# Patient Record
Sex: Female | Born: 1970 | Race: White | Hispanic: No | Marital: Married | Smoking: Never smoker
Health system: Southern US, Community
[De-identification: ages and names within clinical notes are randomized; demographics above are authoritative.]

## PROBLEM LIST (undated history)

## (undated) HISTORY — PX: FOOT SURGERY: SHX648

## (undated) HISTORY — PX: OTHER SURGICAL HISTORY: SHX169

---

## 1997-09-27 ENCOUNTER — Other Ambulatory Visit: Admission: RE | Admit: 1997-09-27 | Discharge: 1997-09-27 | Payer: Self-pay | Admitting: Obstetrics and Gynecology

## 1997-10-16 ENCOUNTER — Other Ambulatory Visit: Admission: RE | Admit: 1997-10-16 | Discharge: 1997-10-16 | Payer: Self-pay | Admitting: Obstetrics and Gynecology

## 1998-09-29 ENCOUNTER — Other Ambulatory Visit: Admission: RE | Admit: 1998-09-29 | Discharge: 1998-09-29 | Payer: Self-pay | Admitting: Obstetrics and Gynecology

## 1999-10-19 ENCOUNTER — Other Ambulatory Visit: Admission: RE | Admit: 1999-10-19 | Discharge: 1999-10-19 | Payer: Self-pay | Admitting: Obstetrics and Gynecology

## 2000-11-18 ENCOUNTER — Other Ambulatory Visit: Admission: RE | Admit: 2000-11-18 | Discharge: 2000-11-18 | Payer: Self-pay | Admitting: Obstetrics and Gynecology

## 2001-11-27 ENCOUNTER — Other Ambulatory Visit: Admission: RE | Admit: 2001-11-27 | Discharge: 2001-11-27 | Payer: Self-pay | Admitting: Obstetrics and Gynecology

## 2002-12-17 ENCOUNTER — Other Ambulatory Visit: Admission: RE | Admit: 2002-12-17 | Discharge: 2002-12-17 | Payer: Self-pay | Admitting: Obstetrics and Gynecology

## 2004-01-21 ENCOUNTER — Other Ambulatory Visit: Admission: RE | Admit: 2004-01-21 | Discharge: 2004-01-21 | Payer: Self-pay | Admitting: Obstetrics and Gynecology

## 2005-02-12 ENCOUNTER — Other Ambulatory Visit: Admission: RE | Admit: 2005-02-12 | Discharge: 2005-02-12 | Payer: Self-pay | Admitting: Obstetrics and Gynecology

## 2008-01-05 DIAGNOSIS — L570 Actinic keratosis: Secondary | ICD-10-CM

## 2008-01-05 HISTORY — DX: Actinic keratosis: L57.0

## 2010-11-24 ENCOUNTER — Ambulatory Visit (HOSPITAL_BASED_OUTPATIENT_CLINIC_OR_DEPARTMENT_OTHER)
Admission: RE | Admit: 2010-11-24 | Discharge: 2010-11-25 | Disposition: A | Discharge status: Home / Self Care | Payer: Federal, State, Local not specified - PPO | Source: Ambulatory Visit | Attending: Orthopaedic Surgery | Admitting: Orthopaedic Surgery

## 2010-11-24 DIAGNOSIS — M2240 Chondromalacia patellae, unspecified knee: Secondary | ICD-10-CM | POA: Insufficient documentation

## 2010-11-24 DIAGNOSIS — S83509A Sprain of unspecified cruciate ligament of unspecified knee, initial encounter: Secondary | ICD-10-CM | POA: Insufficient documentation

## 2010-11-24 DIAGNOSIS — X58XXXA Exposure to other specified factors, initial encounter: Secondary | ICD-10-CM | POA: Insufficient documentation

## 2010-11-24 DIAGNOSIS — Z01812 Encounter for preprocedural laboratory examination: Secondary | ICD-10-CM | POA: Insufficient documentation

## 2010-11-24 LAB — POCT HEMOGLOBIN-HEMACUE: Hemoglobin: 14.3 g/dL (ref 12.0–15.0)

## 2010-12-23 NOTE — Op Note (Signed)
NAMECHARLYN, Nancy Graham            ACCOUNT NO.:  [id number]  MEDICAL RECORD NO.:  [id number]  LOCATION:                                 FACILITY:  PHYSICIAN:  Lubertha Basque. Jerl Santos, M.D.     DATE OF BIRTH:  DATE OF PROCEDURE:  11/24/2010 DATE OF DISCHARGE:                              OPERATIVE REPORT   PREOPERATIVE DIAGNOSES: 1. Right knee anterior cruciate ligament tear. 2. Right knee torn medial meniscus.  POSTOPERATIVE DIAGNOSES: 1. Right knee anterior cruciate ligament tear. 2. Right knee chondromalacia patella.  PROCEDURES: 1. Right knee anterior cruciate ligament reconstruction. 2. Right knee chondroplasty patella.  ANESTHESIA:  General and block.  ATTENDING SURGEON:  Lubertha Basque. Jerl Santos, MD  ASSISTANT:  Lindwood Qua, PA   INDICATIONS FOR PROCEDURE:  The patient is a 40 year old woman who injured her knee a few months back.  She has persisted with an unstable feeling and has not trusted her knee.  By history, exam, and MRI scan, she has an ACL tear and unstable knee.  She is offered reconstruction. Informed operative consent was obtained after discussion of possible complications including reaction to anesthesia, infection, and DVT.  The importance of the postoperative rehabilitation protocol to optimize result was stressed with the patient.  SUMMARY OF FINDINGS AND PROCEDURE:  Under general anesthesia and a block, a right knee arthroscopy was performed.  She had some chondromalacia patella grade 3 in intertrochlear groove and a thorough chondroplasty was done.  The medial compartment exhibited no degenerative change and the meniscus itself looked benign on probing on the superior and inferior aspects.  The ACL was completely torn with a stump of this stuck in an anterior position.  The PCL underneath was intact.  The lateral compartment was benign with no evidence of meniscal or articular cartilage injury.  The ACL stump was removed followed by reconstruction  with middle third patellar tendon allograft stabilized at both ends with metal Linvatec interference screws.  Lindwood Qua assisted throughout and was invaluable to the completion of the case in that he helped position and retract while I performed the procedure. He also fashioned the allograft on the back table while I performed arthroscopic portions of the case thereby significantly minimizing OR time.  DESCRIPTION OF PROCEDURE:  The patient was taken to the operating suite where general anesthetic was applied without difficulty.  She was also given a block in the preanesthesia area.  She was positioned supine and prepped and draped in a normal sterile fashion.  After administration of IV Kefzol, an arthroscopy of the right knee was performed through a total of 2 portals.  Findings were as noted above and procedure consisted of the chondroplasty patella followed by removal of the ACL stump.  A conservative notchplasty was done with a bur until the over- the-top position was well visualized.  The graft was defrosted on the back table while some of this was occurring and was fashioned to fit through 9 and 10 mm tunnels.  Drill holes were placed in each bone plug with a PDS suture placed in one and a wire in the other.  An intraarticular guide was placed just anterior to the PCL.  A separate stab  wound was made anteromedial in the tibia and a guidewire was placed through the stab wound, the tibia, into the knee.  We then over reamed this to a diameter of 11 mm.  A transtibial guide was then placed through this in the over-the-top position relatively low on the femur. A guidewire was placed utilizing this guide out the proximal thigh.  I overreamed the distal femur to a depth of 2.5 cm using a 10-mm reamer. A 1-2 mm posterior wall was well visualized.  Bony debris was removed from the knee with a shaver.  The aforementioned graft was then pulled through the tibial tunnel into the  femoral tunnel.  Care was taken to keep the tendinous surface of the graft in a posterior direction as it entered the tunnel.  We then placed a guidewire anterior to the bone plug in the femoral tunnel and over this placed an 8 x 20 metal Linvatec screw in an interference fashion.  I then tugged on the trailing bone plug and could not dislodge the leading bone plug.  The knee was taken through a good range of motion and the graft was felt to be fairly isometric.  The guidewire was then placed up through the tibial tunnel into the knee and seemed to be there arthroscopically.  Over this, we placed a 9 x 20 metal Linvatec screw securing the trailing bone plug in the tibia.  At this point, the knee ranged fully and the loose Lachman's and drawer tests have been eliminated.  The arthroscopic equipment was removed.  The small wound anteromedial on the tibia was closed with nylon.  Adaptic was applied to the various wounds followed by dry gauze and loose Ace wrap.  Estimated blood loss and intraoperative fluids can be obtained from anesthesia records.  No tourniquet was placed or inflated.  DISPOSITION:  The patient was extubated in the operating room and taken to the recovery room in stable addition.  Plans were for her to stay overnight for observation and pain control with probable discharge home in the morning.     Lubertha Basque Jerl Santos, M.D.     PGD/MEDQ  D:  11/24/2010  T:  11/25/2010  Job:  [id number]  Electronically Signed by Marcene Corning M.D. on 12/23/2010 08:46:43 PM

## 2012-10-03 ENCOUNTER — Ambulatory Visit: Payer: Self-pay | Admitting: Podiatry

## 2013-03-08 ENCOUNTER — Encounter: Payer: Self-pay | Admitting: Podiatry

## 2013-03-08 ENCOUNTER — Ambulatory Visit (INDEPENDENT_AMBULATORY_CARE_PROVIDER_SITE_OTHER): Payer: Federal, State, Local not specified - PPO | Admitting: Podiatry

## 2013-03-08 VITALS — BP 110/76 | HR 84 | Resp 16 | Ht 64.0 in | Wt 198.0 lb

## 2013-03-08 DIAGNOSIS — Z9889 Other specified postprocedural states: Secondary | ICD-10-CM

## 2013-03-08 DIAGNOSIS — M7671 Peroneal tendinitis, right leg: Secondary | ICD-10-CM

## 2013-03-08 DIAGNOSIS — M7750 Other enthesopathy of unspecified foot: Secondary | ICD-10-CM

## 2013-03-08 MED ORDER — METHYLPREDNISOLONE (PAK) 4 MG PO TABS: ORAL_TABLET | ORAL | Status: DC

## 2013-03-08 NOTE — Progress Notes (Signed)
Octavie presents today status post endoscopic plantar fasciotomy from June of 2014. Still complaining of pain to the lateral aspect of the right foot and ankle. Last time she was and we injected her about the fourth fifth metatarsocuboid articulation site. This is going to resolve quite nicely. She states the majority of the pain is present along the peroneal tendons.  Objective evaluation: Pulses remain palpable right lower extremity tenderness on palpation of the peroneal tendons right ankle and foot. No pain on palpation of the medial calcaneal tubercle or the fourth fifth met cuboid articulations.  Assessment: Peroneal tendinitis right.  Plan: Injected Kenalog and local anesthetic to the peroneal tendons today. Wrote her a prescription for Medrol dose pack. I will followup with her in one month at which time if she is no better an MRI will be necessary.

## 2013-04-09 ENCOUNTER — Encounter: Payer: Self-pay | Admitting: Podiatry

## 2013-04-09 ENCOUNTER — Ambulatory Visit (INDEPENDENT_AMBULATORY_CARE_PROVIDER_SITE_OTHER): Payer: Federal, State, Local not specified - PPO | Admitting: Podiatry

## 2013-04-09 VITALS — BP 109/73 | HR 82 | Resp 16 | Ht 64.0 in | Wt 194.0 lb

## 2013-04-09 DIAGNOSIS — M7750 Other enthesopathy of unspecified foot: Secondary | ICD-10-CM

## 2013-04-09 DIAGNOSIS — M778 Other enthesopathies, not elsewhere classified: Secondary | ICD-10-CM

## 2013-04-09 NOTE — Progress Notes (Signed)
Nancy Graham presents today for followup of pain to her right foot. She states that most of the swelling is gone after the steroid pack to be took last time.  Objective: Vital signs are stable she is alert and oriented x3. She has some tenderness on palpation of the peroneal tendons. The majority of her pain is on palpation of the sinus tarsi today and pain on the in range of motion of the subtalar joint.  Assessment: Pain in limb secondary to capsulitis and possible peroneal tendinitis right.  Plan: Injected the subtalar joint today and sinus tarsi with local anesthetic and Kenalog 20 mg. We'll followup with her in one month.

## 2013-04-30 ENCOUNTER — Ambulatory Visit (INDEPENDENT_AMBULATORY_CARE_PROVIDER_SITE_OTHER): Payer: Federal, State, Local not specified - PPO | Admitting: Podiatry

## 2013-04-30 ENCOUNTER — Encounter: Payer: Self-pay | Admitting: Podiatry

## 2013-04-30 VITALS — BP 109/75 | HR 75 | Resp 16

## 2013-04-30 DIAGNOSIS — M7750 Other enthesopathy of unspecified foot: Secondary | ICD-10-CM

## 2013-04-30 DIAGNOSIS — M7670 Peroneal tendinitis, unspecified leg: Secondary | ICD-10-CM

## 2013-04-30 NOTE — Progress Notes (Signed)
   Subjective:    Patient ID: Nancy Graham, female    DOB: February 07, 1971, 42 y.o.   MRN: [id number]  HPI Comments: No change"     Review of Systems     Objective:   Physical Exam: I have reviewed her past medical history medications and allergies. She is status post endoscopic plantar fasciotomy right with lateral compensatory syndrome. Injections to the sinus tarsi into the peroneal tendons as well as to the plantar fascial surgical site have failed to alleviate her symptomatology along the peroneal tendons. She has pain on palpation of the peroneal tendons as they course distally inframalleolar lateral malleolus to the fifth metatarsal base and the arcuate region of the cuboid. All conservative therapies have failed at this point. There is mild overlying edema with fluctuance overlying the peroneal tendons.        Assessment & Plan:  Assessment: Chronic intractable perineal tendinitis right foot status post endoscopic plantar fasciotomy.  Plan: Secondary to failure of conservative therapies an MRI is necessary to evaluate the peroneal tendons for the possibility of a split tear. The split tear is present surgical repair as necessary. This is discussed with the patient today I will followup with her once her MRI has returned.

## 2013-05-01 ENCOUNTER — Other Ambulatory Visit: Payer: Self-pay | Admitting: *Deleted

## 2013-05-01 DIAGNOSIS — M79609 Pain in unspecified limb: Secondary | ICD-10-CM

## 2013-05-01 DIAGNOSIS — M7750 Other enthesopathy of unspecified foot: Secondary | ICD-10-CM

## 2013-05-01 DIAGNOSIS — T148XXA Other injury of unspecified body region, initial encounter: Secondary | ICD-10-CM

## 2013-05-16 ENCOUNTER — Ambulatory Visit: Payer: Self-pay | Admitting: Podiatry

## 2013-05-23 ENCOUNTER — Encounter: Payer: Self-pay | Admitting: Podiatry

## 2013-05-28 ENCOUNTER — Ambulatory Visit (INDEPENDENT_AMBULATORY_CARE_PROVIDER_SITE_OTHER): Payer: Federal, State, Local not specified - PPO | Admitting: Podiatry

## 2013-05-28 ENCOUNTER — Encounter: Payer: Self-pay | Admitting: Podiatry

## 2013-05-28 VITALS — BP 116/78 | HR 78 | Resp 16

## 2013-05-28 DIAGNOSIS — S86319A Strain of muscle(s) and tendon(s) of peroneal muscle group at lower leg level, unspecified leg, initial encounter: Secondary | ICD-10-CM

## 2013-05-28 DIAGNOSIS — S86819A Strain of other muscle(s) and tendon(s) at lower leg level, unspecified leg, initial encounter: Secondary | ICD-10-CM

## 2013-05-28 DIAGNOSIS — S838X9A Sprain of other specified parts of unspecified knee, initial encounter: Secondary | ICD-10-CM

## 2013-05-28 NOTE — Progress Notes (Signed)
Pt states its still there, come to get my mri results today. No change to the right foot.  Objective: Vital signs are stable she is alert and oriented x3. His pain on palpation of the peroneal tendons. MRI report did come back positive for a tear of both her peroneal tendons right foot. Pulses are strongly palpable.  Assessment: Peroneal tendon tears right foot.  Plan we consented her for today for primary repair of peroneal longus and the peroneal brevis tendon with application of cast. We will the consent form today line bylined number by number giving her ample time to ask questions she saw fit regarding these procedures I answered them to the best of my ability in layman's terms. She understood this was amenable to it signed Dr. pages of the consent form and I will followup with her in the near future.

## 2013-06-22 ENCOUNTER — Encounter: Payer: Self-pay | Admitting: Podiatry

## 2013-06-22 DIAGNOSIS — M7750 Other enthesopathy of unspecified foot: Secondary | ICD-10-CM

## 2013-06-27 ENCOUNTER — Ambulatory Visit (INDEPENDENT_AMBULATORY_CARE_PROVIDER_SITE_OTHER): Payer: Federal, State, Local not specified - PPO | Admitting: Podiatry

## 2013-06-27 ENCOUNTER — Encounter: Payer: Self-pay | Admitting: Podiatry

## 2013-06-27 VITALS — BP 129/76 | HR 68 | Resp 16

## 2013-06-27 DIAGNOSIS — Z9889 Other specified postprocedural states: Secondary | ICD-10-CM

## 2013-06-27 NOTE — Progress Notes (Signed)
1. Primary repair peroneus long and peroneus brevis rt foot/ankle 2. Application of cast   rx Percocet 10/325 #50 0 refills one to two by mouth every 6- 8hours as needed for pain Phenergan 25 mg #30 0 refills one by mouth every 6 -8 hours as needed for nausea Keflex 500 mg #20 0 refills one by mouth 2 times daily

## 2013-06-27 NOTE — Progress Notes (Signed)
Post op 2.13.15 , peroneus brevis tendon repair, pt states that it is there , hurting some, last two days didn't have to take pain meds . One week status post peroneal longus repair right ankle.  Objective: Vital signs are stable she is alert and oriented x3. There is no erythema edema saline is drainage or odor. She has pain on palpation of the incision site. She's got a good range of motion of the foot. No signs of infection.  Assessment: Well-healing surgical foot right.  Plan: Redressed today with a dressed a compressive dressing she's to continue use of the Cam Walker all the time a nonweightbearing cast.

## 2013-07-04 ENCOUNTER — Encounter: Payer: Self-pay | Admitting: Podiatry

## 2013-07-04 ENCOUNTER — Ambulatory Visit (INDEPENDENT_AMBULATORY_CARE_PROVIDER_SITE_OTHER): Payer: Federal, State, Local not specified - PPO | Admitting: Podiatry

## 2013-07-04 VITALS — BP 118/82 | HR 82 | Temp 97.8°F | Resp 16

## 2013-07-04 DIAGNOSIS — Z9889 Other specified postprocedural states: Secondary | ICD-10-CM

## 2013-07-04 NOTE — Progress Notes (Signed)
Dos 2.13.15 , peroneus brevis tendon repair right ankle, staples removed , its doing ok, having trouble sleeping at night it feels like it is rubbing and the left hip is hurting. She denies fever chills nausea vomiting muscle aches and pains.  Objective: Vital signs are stable she is alert and oriented x3. Lateral aspect of the right ankle appears to be healing quite nicely we left a few staples intact there is no dehiscence or gapping open of the wound. She has a good range of motion of the ankle and states it really doesn't hurt.  Assessment: Well-healing surgical foot right.  Plan: Redressed the foot today put her back in her Cam Walker nonweightbearing status followup with her in one week to remove the remainder of the staples. At that time my hope to get away from the crutches.

## 2013-07-04 NOTE — Progress Notes (Signed)
Wrote pt a rx for a knee scooter-post op 2.13.15

## 2013-07-11 ENCOUNTER — Ambulatory Visit (INDEPENDENT_AMBULATORY_CARE_PROVIDER_SITE_OTHER): Payer: Federal, State, Local not specified - PPO | Admitting: Podiatry

## 2013-07-11 VITALS — BP 112/72 | HR 84 | Resp 16 | Ht 64.0 in | Wt 189.0 lb

## 2013-07-11 DIAGNOSIS — Z9889 Other specified postprocedural states: Secondary | ICD-10-CM

## 2013-07-11 DIAGNOSIS — S86319A Strain of muscle(s) and tendon(s) of peroneal muscle group at lower leg level, unspecified leg, initial encounter: Secondary | ICD-10-CM

## 2013-07-11 DIAGNOSIS — S838X9A Sprain of other specified parts of unspecified knee, initial encounter: Secondary | ICD-10-CM

## 2013-07-11 DIAGNOSIS — S86819A Strain of other muscle(s) and tendon(s) at lower leg level, unspecified leg, initial encounter: Secondary | ICD-10-CM

## 2013-07-11 NOTE — Progress Notes (Signed)
She presents today 3 weeks status post peroneal tendon repair right foot states that it feels 100% better she has a great range of motion. We remove the remainder of the sutures to the lateral aspect of the right foot today. She has good range of motion of the right foot and ankle today without complications he is to be healing quite nicely.  Assessment: Well-healing surgical foot status post peroneal tendon repair x3 weeks.  Plan: In a compression anklet today we will allow her to start getting this wet I encouraged range of motion exercises she's to continue the use of the Cam Walker all the time. However I did allow her to start putting partial weight bearing to the right foot. I will followup with her in 2 weeks at which time we will try to get out of the boot into a tennis shoe with a brace.

## 2013-07-23 ENCOUNTER — Ambulatory Visit (INDEPENDENT_AMBULATORY_CARE_PROVIDER_SITE_OTHER): Payer: Federal, State, Local not specified - PPO | Admitting: Podiatry

## 2013-07-23 ENCOUNTER — Encounter: Payer: Self-pay | Admitting: Podiatry

## 2013-07-23 VITALS — BP 116/78 | HR 76 | Resp 16

## 2013-07-23 DIAGNOSIS — Z9889 Other specified postprocedural states: Secondary | ICD-10-CM

## 2013-07-23 MED ORDER — METHYLPREDNISOLONE (PAK) 4 MG PO TABS: ORAL_TABLET | ORAL | Status: DC

## 2013-07-23 NOTE — Progress Notes (Signed)
Post op , its doing good, some swelling has started up this week. She presents today ambulating with her crutches and Cam Walker.  Objective: Vital signs are stable she is alert and oriented x3. Little more than a month status post peroneal tendon repair right also states that she has had started to have some of her plantar fasciitis pains return.  Assessment: Well-healing peroneal tendon repair right, residual plantar fasciitis type symptoms right.  Plan: Sterapred Dosepak dispensed today encouraged range of motion exercises. Continue to use the Cam Walker for the next 2 weeks discontinue the use of the crutches and I will followup with her in 2 weeks at which time we will put her in a brace and get her into her tennis shoe.

## 2013-08-06 ENCOUNTER — Ambulatory Visit (INDEPENDENT_AMBULATORY_CARE_PROVIDER_SITE_OTHER): Payer: Federal, State, Local not specified - PPO | Admitting: Podiatry

## 2013-08-06 VITALS — Resp 16 | Ht 64.0 in | Wt 190.0 lb

## 2013-08-06 DIAGNOSIS — S86319A Strain of muscle(s) and tendon(s) of peroneal muscle group at lower leg level, unspecified leg, initial encounter: Secondary | ICD-10-CM

## 2013-08-06 DIAGNOSIS — Z9889 Other specified postprocedural states: Secondary | ICD-10-CM

## 2013-08-06 DIAGNOSIS — S838X9A Sprain of other specified parts of unspecified knee, initial encounter: Secondary | ICD-10-CM

## 2013-08-06 DIAGNOSIS — S86819A Strain of other muscle(s) and tendon(s) at lower leg level, unspecified leg, initial encounter: Secondary | ICD-10-CM

## 2013-08-06 NOTE — Progress Notes (Signed)
Clemencia presents today states it is still having some pain every day. He does not seem to be as bad as it was. She's referring to her lateral ankle repair and her peroneal tendon repair of her right foot.  Objective: Vital signs are stable she is alert and oriented x3. Much decrease in edema to the right foot since last time I saw her. She has good functionality of the peroneal brevis and peroneal longus tendons.  Assessment: Well-healing surgical foot right.  Plan: She is to discontinue use of the Cam Walker start utilizing her try lock brace once again I will followup with her in 2 weeks at which time if she is no better we will send her to physical therapy.

## 2013-08-20 ENCOUNTER — Ambulatory Visit (INDEPENDENT_AMBULATORY_CARE_PROVIDER_SITE_OTHER): Payer: Federal, State, Local not specified - PPO | Admitting: Podiatry

## 2013-08-20 VITALS — Resp 16 | Ht 64.0 in | Wt 190.0 lb

## 2013-08-20 DIAGNOSIS — S86319A Strain of muscle(s) and tendon(s) of peroneal muscle group at lower leg level, unspecified leg, initial encounter: Secondary | ICD-10-CM

## 2013-08-20 DIAGNOSIS — S86819A Strain of other muscle(s) and tendon(s) at lower leg level, unspecified leg, initial encounter: Secondary | ICD-10-CM

## 2013-08-20 DIAGNOSIS — S838X9A Sprain of other specified parts of unspecified knee, initial encounter: Secondary | ICD-10-CM

## 2013-08-20 DIAGNOSIS — Z9889 Other specified postprocedural states: Secondary | ICD-10-CM

## 2013-08-20 NOTE — Progress Notes (Signed)
She presents today for followup of her peroneal tendon repair right foot since February. It is been 8 weeks now. She states is still quite sore she can go for very long periods of time without becoming debilitating.  Objective: Vital signs are stable she is alert and oriented x3. Pulses are palpable she has mild tenderness on palpation of the peroneal tendons of the right foot.  Assessment: Peroneal tendinitis postsurgical.  Plan: We'll send her to physical therapy to see if they can help work some soreness out. I will followup with her in one month

## 2013-09-17 ENCOUNTER — Ambulatory Visit (INDEPENDENT_AMBULATORY_CARE_PROVIDER_SITE_OTHER): Payer: Federal, State, Local not specified - PPO | Admitting: Podiatry

## 2013-09-17 VITALS — BP 119/76 | HR 76 | Resp 16

## 2013-09-17 DIAGNOSIS — S86319A Strain of muscle(s) and tendon(s) of peroneal muscle group at lower leg level, unspecified leg, initial encounter: Secondary | ICD-10-CM

## 2013-09-17 DIAGNOSIS — Z9889 Other specified postprocedural states: Secondary | ICD-10-CM

## 2013-09-17 DIAGNOSIS — S86819A Strain of other muscle(s) and tendon(s) at lower leg level, unspecified leg, initial encounter: Secondary | ICD-10-CM

## 2013-09-17 DIAGNOSIS — S838X9A Sprain of other specified parts of unspecified knee, initial encounter: Secondary | ICD-10-CM

## 2013-09-17 NOTE — Progress Notes (Signed)
She presents today for followup of peroneal tendon repair right foot date of surgery was February 2015. She denies fever chills nausea vomiting. She states that she's doing much better and physical therapy has really made his foot turned the corner.  Objective: Vital signs are stable she is alert and oriented x3 she has no pain on palpation and range of motion of the peroneal tendons either longus or the brevis.  Assessment: Well-healing peroneal tendon repair right.  Plan: Continue physical therapy followup with me in about 6 weeks.

## 2013-10-29 ENCOUNTER — Encounter: Payer: Self-pay | Admitting: Podiatry

## 2013-10-29 ENCOUNTER — Ambulatory Visit (INDEPENDENT_AMBULATORY_CARE_PROVIDER_SITE_OTHER): Payer: Federal, State, Local not specified - PPO | Admitting: Podiatry

## 2013-10-29 DIAGNOSIS — G5791 Unspecified mononeuropathy of right lower limb: Secondary | ICD-10-CM

## 2013-10-29 DIAGNOSIS — Z9889 Other specified postprocedural states: Secondary | ICD-10-CM

## 2013-10-29 DIAGNOSIS — G5790 Unspecified mononeuropathy of unspecified lower limb: Secondary | ICD-10-CM

## 2013-10-29 NOTE — Progress Notes (Signed)
She presents today for followup of her peroneal tendon repair and plantar fasciotomy right foot. She states it finally is starting to turn around and mechanically. She has noticed recently that she started to develop some burning along the medial aspect of her right heel. From the incision site proximally started to burn the burn at any point in time not just at nighttime.  Objective: Of systems is alert and oriented x3 she is no pain on palpation of the peroneal tendons of the right foot. She has full range of motion with the tendons against resistance. She has no reproducible tenderness or burning on palpation of the medial right heel.  Assessment: Well-healing surgical foot right. Neuritis right heel.  Plan: I will followup with her on an as-needed basis for chemical destruction of Baxter's nerve should it become necessary.

## 2013-11-15 ENCOUNTER — Encounter: Payer: Self-pay | Admitting: Podiatry

## 2013-11-15 ENCOUNTER — Ambulatory Visit (INDEPENDENT_AMBULATORY_CARE_PROVIDER_SITE_OTHER): Payer: Federal, State, Local not specified - PPO | Admitting: Podiatry

## 2013-11-15 VITALS — BP 112/71 | HR 78 | Resp 16

## 2013-11-15 DIAGNOSIS — G5790 Unspecified mononeuropathy of unspecified lower limb: Secondary | ICD-10-CM

## 2013-11-15 DIAGNOSIS — G5791 Unspecified mononeuropathy of right lower limb: Secondary | ICD-10-CM

## 2013-11-15 NOTE — Progress Notes (Signed)
She presents today with a chief complaint of heel pain to her right foot. She has a history of plantar fasciitis with a history of an endoscopic fasciotomy. Currently she has developed a neuritis with fasciitis to the right heel. She states that she like to get started with the dehydrated alcohol injections.  Objective: Vital signs are stable she is alert and oriented x3. No erythema edema cellulitis drainage or odor. Pain on palpation medial calcaneal tubercle with radiating pain medially.  Assessment: Neuritis/plantar fasciitis right foot.  Plan: Injected her first dose of dehydrated alcohol today without complication followup with her in 3 weeks.

## 2013-12-03 ENCOUNTER — Ambulatory Visit: Payer: Federal, State, Local not specified - PPO | Admitting: Podiatry

## 2013-12-05 ENCOUNTER — Ambulatory Visit (INDEPENDENT_AMBULATORY_CARE_PROVIDER_SITE_OTHER): Payer: Federal, State, Local not specified - PPO | Admitting: Podiatry

## 2013-12-05 VITALS — BP 115/73 | HR 82 | Resp 16

## 2013-12-05 DIAGNOSIS — G5790 Unspecified mononeuropathy of unspecified lower limb: Secondary | ICD-10-CM

## 2013-12-05 DIAGNOSIS — G5791 Unspecified mononeuropathy of right lower limb: Secondary | ICD-10-CM

## 2013-12-05 NOTE — Progress Notes (Signed)
She presents today for followup with her neuritis/Baxter's neuritis/plantar fasciitis of her right heel. She states it seems to be doing much better from her first dehydrated alcohol injection.  Objective: Vital signs are stable she is alert and oriented x3. Minimal pain on palpation medial calcaneal tubercle of the right heel.  Assessment: Neuritis plantar fasciitis right foot.  Plan: Discussed etiology pathology conservative versus surgical therapies. Injected her second dose of dehydrated alcohol to the point of maximal tenderness right heel. Followup with her in 3 weeks for her third injection.

## 2013-12-24 ENCOUNTER — Ambulatory Visit (INDEPENDENT_AMBULATORY_CARE_PROVIDER_SITE_OTHER): Payer: Federal, State, Local not specified - PPO | Admitting: Podiatry

## 2013-12-24 VITALS — BP 125/81 | HR 90 | Resp 16

## 2013-12-24 DIAGNOSIS — G5791 Unspecified mononeuropathy of right lower limb: Secondary | ICD-10-CM

## 2013-12-24 DIAGNOSIS — G5790 Unspecified mononeuropathy of unspecified lower limb: Secondary | ICD-10-CM

## 2013-12-24 NOTE — Progress Notes (Signed)
She presents today after to injections for neuritis to her right heel. States the right heel is almost 100% better she does have some tenderness that she remarks of the central band of the plantar fascia right.  Objective: Medial band is no pain on palpation. She has pain on palpation of the central band of the plantar fascia minimally so limb limb lateral.  Assessment: Neuritis is resolving possible plantar fasciitis central band right.  Plan: Injected her third dose of dehydrated alcohol today plantar central band today and we will reevaluate in 3 weeks.

## 2014-01-16 ENCOUNTER — Encounter: Payer: Self-pay | Admitting: Podiatry

## 2014-01-16 ENCOUNTER — Ambulatory Visit (INDEPENDENT_AMBULATORY_CARE_PROVIDER_SITE_OTHER): Payer: Federal, State, Local not specified - PPO | Admitting: Podiatry

## 2014-01-16 VITALS — BP 116/76 | HR 88 | Resp 16

## 2014-01-16 DIAGNOSIS — M722 Plantar fascial fibromatosis: Secondary | ICD-10-CM

## 2014-01-16 NOTE — Progress Notes (Signed)
She presents today for followup of neuritis to the plantar aspect of the right foot. She states that she no longer has any burning and tingling in the area she said that part is 100% better however she still has pain right here as she points to the central band of the plantar fascial calcaneal insertion site right heel.  Objective: Vital signs are stable she is alert and oriented x3 she has pain on palpation to the central calcaneal tubercle right heel.  Assessment: Well-healing neuritis and when plantar fasciitis right.  Plan: Under fasciitis central band right foot. Reinjected the right foot today with Kenalog and local anesthetic at the point of maximal tenderness.

## 2014-01-28 ENCOUNTER — Encounter: Payer: Self-pay | Admitting: Podiatry

## 2014-01-28 ENCOUNTER — Ambulatory Visit (INDEPENDENT_AMBULATORY_CARE_PROVIDER_SITE_OTHER): Payer: Federal, State, Local not specified - PPO | Admitting: Podiatry

## 2014-01-28 VITALS — BP 125/82 | HR 82 | Resp 12

## 2014-01-28 DIAGNOSIS — G5791 Unspecified mononeuropathy of right lower limb: Secondary | ICD-10-CM

## 2014-01-28 DIAGNOSIS — G5790 Unspecified mononeuropathy of unspecified lower limb: Secondary | ICD-10-CM

## 2014-01-28 NOTE — Progress Notes (Signed)
She presents today for followup of her neuritis plantar aspect of the right foot. She states it does not feel like plantar fasciitis that it feels more locking nerve problem or dysphagia right in here she points to the medial and plantar medial aspect of the right heel.  Objective: Vital signs are stable she is alert and oriented x3. She has mild tenderness on palpation of the medial calcaneal branch of the posterior tibial nerve at the extensor the medial side of the foot and plantar medial aspect.  Assessment: Neuritis.  Plan: Discussed etiology pathology conservative versus surgical therapies. I reinjected her fourth dose of dehydrated alcohol to her left heel today and I will followup with her in 3 weeks

## 2014-02-18 ENCOUNTER — Encounter: Payer: Self-pay | Admitting: Podiatry

## 2014-02-18 ENCOUNTER — Ambulatory Visit (INDEPENDENT_AMBULATORY_CARE_PROVIDER_SITE_OTHER): Payer: Federal, State, Local not specified - PPO | Admitting: Podiatry

## 2014-02-18 DIAGNOSIS — M722 Plantar fascial fibromatosis: Secondary | ICD-10-CM

## 2014-02-18 DIAGNOSIS — G5791 Unspecified mononeuropathy of right lower limb: Secondary | ICD-10-CM

## 2014-02-18 NOTE — Progress Notes (Signed)
She presents today for her fifth injection of dehydrated alcohol to her right heel. She states that she is a proximally 95% improved.  Objective: Vital signs are stable she is alert and oriented x3. There is no erythema edema cellulitis drainage or odor mild tenderness on palpation medial continued tubercle area.  Assessment: Well-healing plantar fasciitis residual neuritis Baxter's nerve right foot.  Plan: Reinjected her fifth dose of dehydrated alcohol to the right heel today.

## 2014-02-27 ENCOUNTER — Ambulatory Visit: Payer: Federal, State, Local not specified - PPO | Admitting: Podiatry

## 2014-03-13 ENCOUNTER — Ambulatory Visit: Payer: Federal, State, Local not specified - PPO | Admitting: Podiatry

## 2014-03-25 ENCOUNTER — Ambulatory Visit (INDEPENDENT_AMBULATORY_CARE_PROVIDER_SITE_OTHER): Payer: Federal, State, Local not specified - PPO | Admitting: Podiatry

## 2014-03-25 VITALS — BP 112/74 | HR 86 | Resp 16

## 2014-03-25 DIAGNOSIS — G5791 Unspecified mononeuropathy of right lower limb: Secondary | ICD-10-CM

## 2014-03-25 NOTE — Progress Notes (Signed)
She presents today for a follow-up of her neuritis to the plantar right heel. She states that she is 100% resolved.  Objective: Vital signs are stable she is alert and oriented 3. No pain on palpation right heel.  Assessment: Resolving neuritis plantar fasciitis right heel.  Plan: Discussed appropriate shoe gear stretching exercises ice therapy she will follow up with me with recurrence.

## 2014-06-12 ENCOUNTER — Other Ambulatory Visit: Payer: Self-pay | Admitting: Obstetrics and Gynecology

## 2014-06-12 DIAGNOSIS — R928 Other abnormal and inconclusive findings on diagnostic imaging of breast: Secondary | ICD-10-CM

## 2014-06-20 ENCOUNTER — Ambulatory Visit
Admission: RE | Admit: 2014-06-20 | Discharge: 2014-06-20 | Disposition: A | Discharge status: Home / Self Care | Payer: Federal, State, Local not specified - PPO | Source: Ambulatory Visit | Attending: Obstetrics and Gynecology | Admitting: Obstetrics and Gynecology

## 2014-06-20 DIAGNOSIS — R928 Other abnormal and inconclusive findings on diagnostic imaging of breast: Secondary | ICD-10-CM

## 2015-08-11 ENCOUNTER — Ambulatory Visit (INDEPENDENT_AMBULATORY_CARE_PROVIDER_SITE_OTHER): Payer: Federal, State, Local not specified - PPO

## 2015-08-11 ENCOUNTER — Encounter: Payer: Self-pay | Admitting: Podiatry

## 2015-08-11 ENCOUNTER — Ambulatory Visit (INDEPENDENT_AMBULATORY_CARE_PROVIDER_SITE_OTHER): Payer: Federal, State, Local not specified - PPO | Admitting: Podiatry

## 2015-08-11 ENCOUNTER — Other Ambulatory Visit: Payer: Self-pay | Admitting: Podiatry

## 2015-08-11 VITALS — BP 125/82 | HR 88 | Resp 16

## 2015-08-11 DIAGNOSIS — M722 Plantar fascial fibromatosis: Secondary | ICD-10-CM

## 2015-08-11 MED ORDER — MELOXICAM 15 MG PO TABS
15.0000 mg | ORAL_TABLET | Freq: Every day | ORAL | Status: DC
Start: 2015-08-11 — End: 2017-03-09

## 2015-08-11 MED ORDER — METHYLPREDNISOLONE 4 MG PO TBPK: ORAL_TABLET | ORAL | Status: DC

## 2015-08-11 NOTE — Patient Instructions (Signed)

## 2015-08-11 NOTE — Progress Notes (Signed)
She presents today stating that she is having heel pain once again.  Objective: Vital signs are stable she is alert and oriented 3. Pulses are palpable. She has pain on palpation medial calcaneal tubercles bilateral.  Assessment: Pain in limb secondary to plantar fasciitis.  Plan: Injected the bilateral heels today placed her plantar fascial braces and started her on meloxicam.

## 2015-09-22 DIAGNOSIS — G43019 Migraine without aura, intractable, without status migrainosus: Secondary | ICD-10-CM | POA: Diagnosis not present

## 2015-09-22 DIAGNOSIS — G43719 Chronic migraine without aura, intractable, without status migrainosus: Secondary | ICD-10-CM | POA: Diagnosis not present

## 2015-10-07 DIAGNOSIS — N924 Excessive bleeding in the premenopausal period: Secondary | ICD-10-CM | POA: Diagnosis not present

## 2015-10-07 DIAGNOSIS — N946 Dysmenorrhea, unspecified: Secondary | ICD-10-CM | POA: Diagnosis not present

## 2015-10-23 DIAGNOSIS — N800 Endometriosis of uterus: Secondary | ICD-10-CM | POA: Diagnosis not present

## 2015-10-23 DIAGNOSIS — N938 Other specified abnormal uterine and vaginal bleeding: Secondary | ICD-10-CM | POA: Diagnosis not present

## 2015-10-23 DIAGNOSIS — N840 Polyp of corpus uteri: Secondary | ICD-10-CM | POA: Diagnosis not present

## 2015-10-23 DIAGNOSIS — N920 Excessive and frequent menstruation with regular cycle: Secondary | ICD-10-CM | POA: Diagnosis not present

## 2016-02-16 ENCOUNTER — Ambulatory Visit: Payer: Federal, State, Local not specified - PPO | Admitting: Podiatry

## 2016-02-25 ENCOUNTER — Ambulatory Visit (INDEPENDENT_AMBULATORY_CARE_PROVIDER_SITE_OTHER): Payer: Federal, State, Local not specified - PPO | Admitting: Podiatry

## 2016-02-25 ENCOUNTER — Encounter: Payer: Self-pay | Admitting: Podiatry

## 2016-02-25 DIAGNOSIS — M722 Plantar fascial fibromatosis: Secondary | ICD-10-CM

## 2016-02-25 NOTE — Progress Notes (Signed)
She presents today for chief complaint of plantar fasciitis. She states the right foot which had peroneal surgery and revisional EPF does pretty well for the most part however he still is tender and burns on occasion. The majority of her symptoms are currently in the left foot.  Plan: Vital signs are stable she is alert and oriented 3. Pulses are strongly palpable. Neurologic system is intact. Deep tendon reflexes are intact. Muscle strength is normal. She has pain on palpation medial calcaneal tubercle of the left heel.  Assessment: Chronic intractable plantar fasciitis left.  Plan: Reinjected the left heel today and she was scanned for set of orthotics. Follow up with her in 1 month.

## 2016-03-08 ENCOUNTER — Encounter: Payer: Self-pay | Admitting: Podiatry

## 2016-03-29 ENCOUNTER — Ambulatory Visit: Payer: Federal, State, Local not specified - PPO | Admitting: Podiatry

## 2016-03-29 DIAGNOSIS — K0263 Dental caries on smooth surface penetrating into pulp: Secondary | ICD-10-CM | POA: Diagnosis not present

## 2016-04-05 DIAGNOSIS — L578 Other skin changes due to chronic exposure to nonionizing radiation: Secondary | ICD-10-CM | POA: Diagnosis not present

## 2016-04-05 DIAGNOSIS — L570 Actinic keratosis: Secondary | ICD-10-CM | POA: Diagnosis not present

## 2016-04-05 DIAGNOSIS — D229 Melanocytic nevi, unspecified: Secondary | ICD-10-CM | POA: Diagnosis not present

## 2016-04-05 DIAGNOSIS — Z1283 Encounter for screening for malignant neoplasm of skin: Secondary | ICD-10-CM | POA: Diagnosis not present

## 2016-04-05 DIAGNOSIS — D485 Neoplasm of uncertain behavior of skin: Secondary | ICD-10-CM | POA: Diagnosis not present

## 2016-04-05 DIAGNOSIS — D225 Melanocytic nevi of trunk: Secondary | ICD-10-CM | POA: Diagnosis not present

## 2016-04-05 DIAGNOSIS — L821 Other seborrheic keratosis: Secondary | ICD-10-CM | POA: Diagnosis not present

## 2016-04-12 DIAGNOSIS — G43019 Migraine without aura, intractable, without status migrainosus: Secondary | ICD-10-CM | POA: Diagnosis not present

## 2016-04-12 DIAGNOSIS — G43719 Chronic migraine without aura, intractable, without status migrainosus: Secondary | ICD-10-CM | POA: Diagnosis not present

## 2016-04-14 ENCOUNTER — Ambulatory Visit (INDEPENDENT_AMBULATORY_CARE_PROVIDER_SITE_OTHER): Payer: Federal, State, Local not specified - PPO | Admitting: Podiatry

## 2016-04-14 DIAGNOSIS — M722 Plantar fascial fibromatosis: Secondary | ICD-10-CM | POA: Diagnosis not present

## 2016-04-14 NOTE — Progress Notes (Signed)
She presents today to pick up her orthotics and states that she is still having some pain to the plantar aspect of the left foot.  Objective: Her signs are stable she is alert and oriented 3 tenderness on palpation medially continue tubercle of the left chills present. Pulses remain palpable. No calf pain.  Assessment: Intractable plantar fasciitis left heel.  Plan: Injected the left heel today and dispensed orthotics with both oral home-going instructions for care and use of the orthotics. I will follow up with her in 1 month at which time she has not improved we'll consider an endoscopic plantar fasciotomy of the left foot. She has already had an EPF to the right foot.

## 2016-05-12 ENCOUNTER — Ambulatory Visit: Payer: Federal, State, Local not specified - PPO | Admitting: Podiatry

## 2016-06-08 DIAGNOSIS — Z23 Encounter for immunization: Secondary | ICD-10-CM | POA: Diagnosis not present

## 2016-06-08 DIAGNOSIS — E7800 Pure hypercholesterolemia, unspecified: Secondary | ICD-10-CM | POA: Diagnosis not present

## 2016-06-08 DIAGNOSIS — G43909 Migraine, unspecified, not intractable, without status migrainosus: Secondary | ICD-10-CM | POA: Diagnosis not present

## 2016-06-08 DIAGNOSIS — Z79899 Other long term (current) drug therapy: Secondary | ICD-10-CM | POA: Diagnosis not present

## 2016-06-08 DIAGNOSIS — K219 Gastro-esophageal reflux disease without esophagitis: Secondary | ICD-10-CM | POA: Diagnosis not present

## 2016-06-14 DIAGNOSIS — Z6833 Body mass index (BMI) 33.0-33.9, adult: Secondary | ICD-10-CM | POA: Diagnosis not present

## 2016-06-14 DIAGNOSIS — Z01419 Encounter for gynecological examination (general) (routine) without abnormal findings: Secondary | ICD-10-CM | POA: Diagnosis not present

## 2016-06-14 DIAGNOSIS — Z1231 Encounter for screening mammogram for malignant neoplasm of breast: Secondary | ICD-10-CM | POA: Diagnosis not present

## 2016-09-01 DIAGNOSIS — R110 Nausea: Secondary | ICD-10-CM | POA: Diagnosis not present

## 2016-09-01 DIAGNOSIS — R197 Diarrhea, unspecified: Secondary | ICD-10-CM | POA: Diagnosis not present

## 2016-09-13 ENCOUNTER — Ambulatory Visit (INDEPENDENT_AMBULATORY_CARE_PROVIDER_SITE_OTHER): Payer: Federal, State, Local not specified - PPO | Admitting: Podiatry

## 2016-09-13 ENCOUNTER — Encounter: Payer: Self-pay | Admitting: Podiatry

## 2016-09-13 DIAGNOSIS — M722 Plantar fascial fibromatosis: Secondary | ICD-10-CM | POA: Diagnosis not present

## 2016-09-13 NOTE — Progress Notes (Signed)
Nancy Graham presents today for a surgical consult regarding her left foot. She's had similar procedure to the right foot in the past but states that this left foot is just not getting well. She'll like to have surgery sometime in June.  Objective: Vital signs are stable she is alert and oriented 3. Pulses are palpable. Neurologic sensorium is intact. Severe pain on palpation medially continue tubercle her left heel.  Assessment: Chronic intractable plantar fasciitis left.  Plan: We went over the consent form today line by line number by number giving her ample time to ask questions she saw fit regarding an endoscopic plantar fasciotomy of the left foot. We did discuss all the possible postoperative complications which may include but are not limited to postop pain bleeding as well as infection recurrence and need for further surgery. We dispensed a cam walker today and I will follow-up with her date of surgery.

## 2016-09-13 NOTE — Patient Instructions (Signed)
Pre-Operative Instructions  Congratulations, you have decided to take an important step to improving your quality of life.  You can be assured that the doctors of [facility] will be with you every step of the way.  1. Plan to be at the surgery center/hospital at least 1 (one) hour prior to your scheduled time unless otherwise directed by the surgical center/hospital staff.  You must have a responsible adult accompany you, remain during the surgery and drive you home.  Make sure you have directions to the surgical center/hospital and know how to get there on time. 2. For hospital based surgery you will need to obtain a history and physical form from your family physician within 1 month prior to the date of surgery- we will give you a form for you primary physician.  3. We make every effort to accommodate the date you request for surgery.  There are however, times where surgery dates or times have to be moved.  We will contact you as soon as possible if a change in schedule is required.   4. No Aspirin/Ibuprofen for one week before surgery.  If you are on aspirin, any non-steroidal anti-inflammatory medications (Mobic, Aleve, Ibuprofen) you should stop taking it 7 days prior to your surgery.  You make take Tylenol  For pain prior to surgery.  5. Medications- If you are taking daily heart and blood pressure medications, seizure, reflux, allergy, asthma, anxiety, pain or diabetes medications, make sure the surgery center/hospital is aware before the day of surgery so they may notify you which medications to take or avoid the day of surgery. 6. No food or drink after midnight the night before surgery unless directed otherwise by surgical center/hospital staff. 7. No alcoholic beverages 24 hours prior to surgery.  No smoking 24 hours prior to or 24 hours after surgery. 8. Wear loose pants or shorts- loose enough to fit over bandages, boots, and casts. 9. No slip on shoes, sneakers are best. 10. Bring  your boot with you to the surgery center/hospital.  Also bring crutches or a walker if your physician has prescribed it for you.  If you do not have this equipment, it will be provided for you after surgery. 11. If you have not been contracted by the surgery center/hospital by the day before your surgery, call to confirm the date and time of your surgery. 12. Leave-time from work may vary depending on the type of surgery you have.  Appropriate arrangements should be made prior to surgery with your employer. 13. Prescriptions will be provided immediately following surgery by your doctor.  Have these filled as soon as possible after surgery and take the medication as directed. 14. Remove nail polish on the operative foot. 15. Wash the night before surgery.  The night before surgery wash the foot and leg well with the antibacterial soap provided and water paying special attention to beneath the toenails and in between the toes.  Rinse thoroughly with water and dry well with a towel.  Perform this wash unless told not to do so by your physician.  Enclosed: 1 Ice pack (please put in freezer the night before surgery)   1 Hibiclens skin cleaner   Pre-op Instructions  If you have any questions regarding the instructions, do not hesitate to call our office.  [place]: [address] [place], [place] [address] [phone]  [place]: [address] [place], [place] [address] [phone]  [place]: [address]  [place], [place] [address] [phone]   Dr.   Norman Regal DPM, Dr. Matthew Wagoner DPM, Dr. M. Todd Hyatt DPM, Dr. Titorya Stover DPM 

## 2016-09-14 ENCOUNTER — Telehealth: Payer: Self-pay | Admitting: *Deleted

## 2016-09-14 NOTE — Telephone Encounter (Signed)
"  I saw Dr. Milinda Pointer yesterday and he told me to give you a call to set up my surgery."  Do you know when you would like to do it?  "Yes, I'd like to do it on June 15."  June 15 is available.  I will get it scheduled.  You can register with the surgical center now.  Someone from the surgical center will call a day or two prior to with the arrival time.  "Can you let me know how much I will have to pay out of pocket?"  I will get Jocelyn Lamer to give you a call with an estimate.  You will have to call the surgical center to get their fee as well as anesthesia.  (EPF Lt, 30 minutes)

## 2016-10-08 DIAGNOSIS — G43719 Chronic migraine without aura, intractable, without status migrainosus: Secondary | ICD-10-CM | POA: Diagnosis not present

## 2016-10-08 DIAGNOSIS — G43019 Migraine without aura, intractable, without status migrainosus: Secondary | ICD-10-CM | POA: Diagnosis not present

## 2016-10-20 ENCOUNTER — Other Ambulatory Visit: Payer: Self-pay | Admitting: Podiatry

## 2016-10-20 MED ORDER — CLINDAMYCIN HCL 150 MG PO CAPS: 150.0000 mg | ORAL_CAPSULE | Freq: Three times a day (TID) | ORAL | 0 refills | Status: DC

## 2016-10-20 MED ORDER — ONDANSETRON HCL 4 MG PO TABS: 4.0000 mg | ORAL_TABLET | Freq: Three times a day (TID) | ORAL | 0 refills | Status: DC | PRN

## 2016-10-20 MED ORDER — HYDROMORPHONE HCL 4 MG PO TABS: 4.0000 mg | ORAL_TABLET | ORAL | 0 refills | Status: DC | PRN

## 2016-10-22 ENCOUNTER — Encounter: Payer: Self-pay | Admitting: Podiatry

## 2016-10-22 DIAGNOSIS — K219 Gastro-esophageal reflux disease without esophagitis: Secondary | ICD-10-CM | POA: Diagnosis not present

## 2016-10-22 DIAGNOSIS — M25572 Pain in left ankle and joints of left foot: Secondary | ICD-10-CM | POA: Diagnosis not present

## 2016-10-22 DIAGNOSIS — M722 Plantar fascial fibromatosis: Secondary | ICD-10-CM | POA: Diagnosis not present

## 2016-10-27 ENCOUNTER — Ambulatory Visit (INDEPENDENT_AMBULATORY_CARE_PROVIDER_SITE_OTHER): Payer: Federal, State, Local not specified - PPO | Admitting: Podiatry

## 2016-10-27 VITALS — BP 110/74 | HR 79 | Temp 98.1°F | Resp 16

## 2016-10-27 DIAGNOSIS — M722 Plantar fascial fibromatosis: Secondary | ICD-10-CM

## 2016-10-28 NOTE — Progress Notes (Signed)
She presents today wearing her cam walker she is status post 1 week endoscopic fasciotomy left foot. States it is doing well a couple toes are still a little bit numb.  Objective: Vital signs are stable alert and oriented 3. Pulses are palpable. Dry sterile dressing intact was removed demonstrates no erythema edema cellulitis drainage or odor sutures are intact R ginger coapting.  Assessment: Bone healing surgical foot left.  Plan: Encouraged her to continue the use of her cam walker for the next week. I will allow her to start getting this wet. One week from now she will have the sutures removed be placed in a compression anklet and get back into her regular tennis shoe. She will continue to wear her night splint for 1 month after this. If she has trouble ambulating in the tennis shoes and she is to continue her cam walker intermittently until she can stay in her tennis shoe. There is no need for a Darco shoe.

## 2016-10-29 ENCOUNTER — Encounter: Payer: Federal, State, Local not specified - PPO | Admitting: Podiatry

## 2016-11-05 ENCOUNTER — Encounter: Payer: Self-pay | Admitting: Podiatry

## 2016-11-05 ENCOUNTER — Ambulatory Visit (INDEPENDENT_AMBULATORY_CARE_PROVIDER_SITE_OTHER): Payer: Self-pay | Admitting: Podiatry

## 2016-11-05 DIAGNOSIS — Z9889 Other specified postprocedural states: Secondary | ICD-10-CM

## 2016-11-05 NOTE — Progress Notes (Signed)
DOS 06.15.2018 Endoscopic plantar fasciotomy left.

## 2016-11-05 NOTE — Progress Notes (Signed)
   Subjective:  Patient presents today status post endoscopic plantar fasciotomy left foot performed by Dr. Milinda Pointer. DOS: 10/22/2016. Patient states that she's doing very well other than numbness to the third digit of her left foot. She denies significant pain at the moment    Objective/Physical Exam Skin incisions appear to be well coapted with sutures and staples intact. No sign of infectious process noted. No dehiscence. No active bleeding noted. Moderate edema noted to the surgical extremity.  Assessment: 1. s/p endoscopic plantar fasciotomy left foot by Dr. Milinda Pointer. DOS: 10/22/2016   Plan of Care:  1. Patient was evaluated. Today sutures were removed. Dry sterile dressing applied. 2. Patient can discontinue the immobilization cam boot and begin wearing good supportive tennis shoes. Recommend antibiotic ointment and Band-Aid daily to the incision sites. 3. Return to clinic in 2 weeks with Dr. Milinda Pointer   Edrick Kins, DPM [facility]  Dr. Edrick Kins, [address]                                        [place], [place] [address]                Office [phone]  Fax [phone]

## 2016-11-29 ENCOUNTER — Encounter: Payer: Self-pay | Admitting: Podiatry

## 2016-11-29 ENCOUNTER — Ambulatory Visit (INDEPENDENT_AMBULATORY_CARE_PROVIDER_SITE_OTHER): Payer: Federal, State, Local not specified - PPO | Admitting: Podiatry

## 2016-11-29 DIAGNOSIS — M722 Plantar fascial fibromatosis: Secondary | ICD-10-CM

## 2016-11-29 NOTE — Progress Notes (Signed)
She presents today for her endoscopic plantar fasciotomy date of surgery 10/22/2016 left foot. She states she still has some numbness to her toes #3 number for the left foot but its pins and needles and the numbness to the second toe has returned to normal.  Objective: Vital signs are stable alert and oriented 3 states she really doesn't have any pain more no reproducible pain is noted on evaluation.  Assessment: Well-healing surgical foot left.  Plan: Follow up with me on an as-needed basis.

## 2017-03-09 ENCOUNTER — Ambulatory Visit (INDEPENDENT_AMBULATORY_CARE_PROVIDER_SITE_OTHER): Payer: Federal, State, Local not specified - PPO | Admitting: Podiatry

## 2017-03-09 ENCOUNTER — Encounter: Payer: Self-pay | Admitting: Podiatry

## 2017-03-09 DIAGNOSIS — M722 Plantar fascial fibromatosis: Secondary | ICD-10-CM | POA: Diagnosis not present

## 2017-03-09 MED ORDER — METHYLPREDNISOLONE 4 MG PO TBPK: ORAL_TABLET | ORAL | 0 refills | Status: DC

## 2017-03-09 MED ORDER — CELECOXIB 100 MG PO CAPS: 100.0000 mg | ORAL_CAPSULE | Freq: Two times a day (BID) | ORAL | 1 refills | Status: DC

## 2017-03-12 NOTE — Progress Notes (Signed)
She presents today for follow-up of her left heel. She had surgery back in June 2018 status post EPF left. She states that S and inflammation and healing in and starting a little more sore.  Objective: Vital signs are stable she is alert and oriented 3. Pulses are palpable. She has no palpation medial trochanter tubercle of the left heel.  Assessment: Fasciitis central band left heel.  Plan: I injected the area today and started her on a Medrol Dosepak to be followed by Celebrex.. Follow up with her as needed.

## 2017-04-05 DIAGNOSIS — L814 Other melanin hyperpigmentation: Secondary | ICD-10-CM | POA: Diagnosis not present

## 2017-04-05 DIAGNOSIS — Z1283 Encounter for screening for malignant neoplasm of skin: Secondary | ICD-10-CM | POA: Diagnosis not present

## 2017-04-05 DIAGNOSIS — L820 Inflamed seborrheic keratosis: Secondary | ICD-10-CM | POA: Diagnosis not present

## 2017-04-05 DIAGNOSIS — L578 Other skin changes due to chronic exposure to nonionizing radiation: Secondary | ICD-10-CM | POA: Diagnosis not present

## 2017-04-05 DIAGNOSIS — L821 Other seborrheic keratosis: Secondary | ICD-10-CM | POA: Diagnosis not present

## 2017-07-26 DIAGNOSIS — G43909 Migraine, unspecified, not intractable, without status migrainosus: Secondary | ICD-10-CM | POA: Diagnosis not present

## 2017-07-26 DIAGNOSIS — Z79899 Other long term (current) drug therapy: Secondary | ICD-10-CM | POA: Diagnosis not present

## 2017-07-26 DIAGNOSIS — K219 Gastro-esophageal reflux disease without esophagitis: Secondary | ICD-10-CM | POA: Diagnosis not present

## 2017-07-26 DIAGNOSIS — E7800 Pure hypercholesterolemia, unspecified: Secondary | ICD-10-CM | POA: Diagnosis not present

## 2017-08-08 DIAGNOSIS — Z1231 Encounter for screening mammogram for malignant neoplasm of breast: Secondary | ICD-10-CM | POA: Diagnosis not present

## 2017-08-08 DIAGNOSIS — Z01419 Encounter for gynecological examination (general) (routine) without abnormal findings: Secondary | ICD-10-CM | POA: Diagnosis not present

## 2017-08-08 DIAGNOSIS — Z6837 Body mass index (BMI) 37.0-37.9, adult: Secondary | ICD-10-CM | POA: Diagnosis not present

## 2017-08-20 DIAGNOSIS — H66001 Acute suppurative otitis media without spontaneous rupture of ear drum, right ear: Secondary | ICD-10-CM | POA: Diagnosis not present

## 2017-10-21 DIAGNOSIS — M542 Cervicalgia: Secondary | ICD-10-CM | POA: Diagnosis not present

## 2017-11-01 DIAGNOSIS — J020 Streptococcal pharyngitis: Secondary | ICD-10-CM | POA: Diagnosis not present

## 2017-12-07 DIAGNOSIS — M542 Cervicalgia: Secondary | ICD-10-CM | POA: Diagnosis not present

## 2017-12-13 DIAGNOSIS — M542 Cervicalgia: Secondary | ICD-10-CM | POA: Diagnosis not present

## 2017-12-16 DIAGNOSIS — M542 Cervicalgia: Secondary | ICD-10-CM | POA: Diagnosis not present

## 2017-12-19 DIAGNOSIS — M542 Cervicalgia: Secondary | ICD-10-CM | POA: Diagnosis not present

## 2017-12-27 DIAGNOSIS — M542 Cervicalgia: Secondary | ICD-10-CM | POA: Diagnosis not present

## 2017-12-29 DIAGNOSIS — M542 Cervicalgia: Secondary | ICD-10-CM | POA: Diagnosis not present

## 2018-01-04 DIAGNOSIS — M542 Cervicalgia: Secondary | ICD-10-CM | POA: Diagnosis not present

## 2018-01-06 DIAGNOSIS — M542 Cervicalgia: Secondary | ICD-10-CM | POA: Diagnosis not present

## 2018-01-11 DIAGNOSIS — M47812 Spondylosis without myelopathy or radiculopathy, cervical region: Secondary | ICD-10-CM | POA: Diagnosis not present

## 2018-02-03 DIAGNOSIS — M47812 Spondylosis without myelopathy or radiculopathy, cervical region: Secondary | ICD-10-CM | POA: Diagnosis not present

## 2018-03-28 DIAGNOSIS — R350 Frequency of micturition: Secondary | ICD-10-CM | POA: Diagnosis not present

## 2018-03-31 DIAGNOSIS — R350 Frequency of micturition: Secondary | ICD-10-CM | POA: Diagnosis not present

## 2018-03-31 DIAGNOSIS — N329 Bladder disorder, unspecified: Secondary | ICD-10-CM | POA: Diagnosis not present

## 2018-04-10 DIAGNOSIS — Z85828 Personal history of other malignant neoplasm of skin: Secondary | ICD-10-CM

## 2018-04-10 DIAGNOSIS — C44612 Basal cell carcinoma of skin of right upper limb, including shoulder: Secondary | ICD-10-CM | POA: Diagnosis not present

## 2018-04-10 DIAGNOSIS — Z1283 Encounter for screening for malignant neoplasm of skin: Secondary | ICD-10-CM | POA: Diagnosis not present

## 2018-04-10 DIAGNOSIS — L578 Other skin changes due to chronic exposure to nonionizing radiation: Secondary | ICD-10-CM | POA: Diagnosis not present

## 2018-04-10 DIAGNOSIS — D229 Melanocytic nevi, unspecified: Secondary | ICD-10-CM | POA: Diagnosis not present

## 2018-04-10 DIAGNOSIS — D485 Neoplasm of uncertain behavior of skin: Secondary | ICD-10-CM | POA: Diagnosis not present

## 2018-04-10 HISTORY — DX: Personal history of other malignant neoplasm of skin: Z85.828

## 2018-04-11 DIAGNOSIS — M6281 Muscle weakness (generalized): Secondary | ICD-10-CM | POA: Diagnosis not present

## 2018-04-11 DIAGNOSIS — R350 Frequency of micturition: Secondary | ICD-10-CM | POA: Diagnosis not present

## 2018-04-11 DIAGNOSIS — R3915 Urgency of urination: Secondary | ICD-10-CM | POA: Diagnosis not present

## 2018-04-11 DIAGNOSIS — M6289 Other specified disorders of muscle: Secondary | ICD-10-CM | POA: Diagnosis not present

## 2018-04-19 DIAGNOSIS — N3946 Mixed incontinence: Secondary | ICD-10-CM | POA: Diagnosis not present

## 2018-04-19 DIAGNOSIS — M6281 Muscle weakness (generalized): Secondary | ICD-10-CM | POA: Diagnosis not present

## 2018-04-19 DIAGNOSIS — R3915 Urgency of urination: Secondary | ICD-10-CM | POA: Diagnosis not present

## 2018-04-19 DIAGNOSIS — M62838 Other muscle spasm: Secondary | ICD-10-CM | POA: Diagnosis not present

## 2018-04-30 DIAGNOSIS — H6983 Other specified disorders of Eustachian tube, bilateral: Secondary | ICD-10-CM | POA: Diagnosis not present

## 2018-10-25 ENCOUNTER — Other Ambulatory Visit: Payer: Self-pay | Admitting: Obstetrics and Gynecology

## 2018-10-25 DIAGNOSIS — R928 Other abnormal and inconclusive findings on diagnostic imaging of breast: Secondary | ICD-10-CM

## 2018-11-01 ENCOUNTER — Other Ambulatory Visit: Payer: Self-pay | Admitting: Obstetrics and Gynecology

## 2018-11-01 ENCOUNTER — Ambulatory Visit
Admission: RE | Admit: 2018-11-01 | Discharge: 2018-11-01 | Disposition: A | Discharge status: Home / Self Care | Payer: Federal, State, Local not specified - PPO | Source: Ambulatory Visit | Attending: Obstetrics and Gynecology | Admitting: Obstetrics and Gynecology

## 2018-11-01 ENCOUNTER — Other Ambulatory Visit: Payer: Self-pay

## 2018-11-01 DIAGNOSIS — R928 Other abnormal and inconclusive findings on diagnostic imaging of breast: Secondary | ICD-10-CM

## 2018-11-02 ENCOUNTER — Ambulatory Visit
Admission: RE | Admit: 2018-11-02 | Discharge: 2018-11-02 | Disposition: A | Discharge status: Home / Self Care | Payer: Federal, State, Local not specified - PPO | Source: Ambulatory Visit | Attending: Obstetrics and Gynecology | Admitting: Obstetrics and Gynecology

## 2018-11-02 ENCOUNTER — Other Ambulatory Visit: Payer: Self-pay | Admitting: Obstetrics and Gynecology

## 2018-11-02 DIAGNOSIS — R928 Other abnormal and inconclusive findings on diagnostic imaging of breast: Secondary | ICD-10-CM

## 2018-11-07 ENCOUNTER — Other Ambulatory Visit: Payer: Self-pay

## 2018-11-07 ENCOUNTER — Ambulatory Visit
Admission: RE | Admit: 2018-11-07 | Discharge: 2018-11-07 | Disposition: A | Discharge status: Home / Self Care | Payer: Federal, State, Local not specified - PPO | Source: Ambulatory Visit | Attending: Obstetrics and Gynecology | Admitting: Obstetrics and Gynecology

## 2018-11-07 ENCOUNTER — Other Ambulatory Visit: Payer: Self-pay | Admitting: Obstetrics and Gynecology

## 2018-11-07 DIAGNOSIS — R928 Other abnormal and inconclusive findings on diagnostic imaging of breast: Secondary | ICD-10-CM

## 2019-05-21 DIAGNOSIS — C4491 Basal cell carcinoma of skin, unspecified: Secondary | ICD-10-CM

## 2019-05-21 HISTORY — DX: Basal cell carcinoma of skin, unspecified: C44.91

## 2019-07-07 ENCOUNTER — Ambulatory Visit: Payer: Federal, State, Local not specified - PPO | Attending: Internal Medicine

## 2019-07-07 DIAGNOSIS — Z23 Encounter for immunization: Secondary | ICD-10-CM | POA: Insufficient documentation

## 2019-07-07 NOTE — Progress Notes (Signed)
   Covid-19 Vaccination Clinic  Name:  Nancy Graham    MRN: [id number] DOB: 1970-06-17  07/07/2019  [name] was observed post Covid-19 immunization for 15 minutes without incidence. She was provided with Vaccine Information Sheet and instruction to access the V-Safe system.   [name] was instructed to call 911 with any severe reactions post vaccine: Marland Kitchen Difficulty breathing  . Swelling of your face and throat  . A fast heartbeat  . A bad rash all over your body  . Dizziness and weakness    Immunizations Administered    Name Date Dose VIS Date Route   Pfizer COVID-19 Vaccine 07/07/2019  4:49 PM 0.3 mL 04/20/2019 Intramuscular   Manufacturer: [place]   Lot: [id number]   [place]: [id number]

## 2019-07-28 ENCOUNTER — Ambulatory Visit: Payer: Federal, State, Local not specified - PPO | Attending: Internal Medicine

## 2019-07-28 DIAGNOSIS — Z23 Encounter for immunization: Secondary | ICD-10-CM

## 2019-07-28 NOTE — Progress Notes (Signed)
   Covid-19 Vaccination Clinic  Name:  Nancy Graham    MRN: [id number] DOB: March 10, 1971  07/28/2019  [name] was observed post Covid-19 immunization for 15 minutes without incident. She was provided with Vaccine Information Sheet and instruction to access the V-Safe system.   [name] was instructed to call 911 with any severe reactions post vaccine: Marland Kitchen Difficulty breathing  . Swelling of face and throat  . A fast heartbeat  . A bad rash all over body  . Dizziness and weakness   Immunizations Administered    Name Date Dose VIS Date Route   Pfizer COVID-19 Vaccine 07/28/2019  8:56 AM 0.3 mL 04/20/2019 Intramuscular   Manufacturer: [place]   Lot: [id number]   [place]: [id number]

## 2019-08-06 ENCOUNTER — Other Ambulatory Visit (HOSPITAL_COMMUNITY): Payer: Self-pay | Admitting: Physician Assistant

## 2019-08-06 ENCOUNTER — Encounter (HOSPITAL_COMMUNITY): Payer: Self-pay | Admitting: Physician Assistant

## 2019-08-06 DIAGNOSIS — R079 Chest pain, unspecified: Secondary | ICD-10-CM

## 2019-08-17 ENCOUNTER — Other Ambulatory Visit (HOSPITAL_COMMUNITY)
Admission: RE | Admit: 2019-08-17 | Discharge: 2019-08-17 | Disposition: A | Discharge status: Home / Self Care | Payer: BC Managed Care – PPO | Source: Ambulatory Visit | Attending: Physician Assistant | Admitting: Physician Assistant

## 2019-08-17 DIAGNOSIS — Z01812 Encounter for preprocedural laboratory examination: Secondary | ICD-10-CM | POA: Insufficient documentation

## 2019-08-17 DIAGNOSIS — Z20822 Contact with and (suspected) exposure to covid-19: Secondary | ICD-10-CM | POA: Diagnosis not present

## 2019-08-18 LAB — SARS CORONAVIRUS 2 (TAT 6-24 HRS): SARS Coronavirus 2: NEGATIVE

## 2019-08-20 ENCOUNTER — Telehealth (HOSPITAL_COMMUNITY): Payer: Self-pay

## 2019-08-20 NOTE — Telephone Encounter (Signed)
Detailed instructions left on the patient's answering machine. Asked to call back with any questions. S.Smera Guyette EMTP 

## 2019-08-21 ENCOUNTER — Ambulatory Visit (HOSPITAL_COMMUNITY): Payer: BC Managed Care – PPO | Attending: Cardiovascular Disease

## 2019-08-21 ENCOUNTER — Other Ambulatory Visit: Payer: Self-pay

## 2019-08-21 DIAGNOSIS — R079 Chest pain, unspecified: Secondary | ICD-10-CM | POA: Diagnosis not present

## 2019-08-21 MED ORDER — TECHNETIUM TC 99M TETROFOSMIN IV KIT
32.5000 | PACK | Freq: Once | INTRAVENOUS | Status: AC | PRN
  Administered 2019-08-21: 32.5 via INTRAVENOUS
  Filled 2019-08-21: qty 33

## 2019-08-22 ENCOUNTER — Ambulatory Visit (HOSPITAL_COMMUNITY): Payer: BC Managed Care – PPO | Attending: Cardiovascular Disease

## 2019-08-22 LAB — MYOCARDIAL PERFUSION IMAGING
Estimated workload: 7 METS
Exercise duration (min): 6 min
Exercise duration (sec): 1 s
LV dias vol: 50 mL (ref 46–106)
LV sys vol: 19 mL
MPHR: 171 {beats}/min
Peak HR: 162 {beats}/min
Percent HR: 94 %
RPE: 18
Rest HR: 93 {beats}/min
SDS: 3
SRS: 0
SSS: 3
TID: 0.72

## 2019-08-22 MED ORDER — TECHNETIUM TC 99M TETROFOSMIN IV KIT
30.3000 | PACK | Freq: Once | INTRAVENOUS | Status: AC | PRN
  Administered 2019-08-22: 30.3 via INTRAVENOUS
  Filled 2019-08-22: qty 31

## 2019-11-20 ENCOUNTER — Other Ambulatory Visit: Payer: Self-pay | Admitting: Dermatology

## 2019-11-29 DIAGNOSIS — Z1231 Encounter for screening mammogram for malignant neoplasm of breast: Secondary | ICD-10-CM | POA: Diagnosis not present

## 2019-12-11 ENCOUNTER — Ambulatory Visit (INDEPENDENT_AMBULATORY_CARE_PROVIDER_SITE_OTHER): Payer: BC Managed Care – PPO | Admitting: Dermatology

## 2019-12-11 ENCOUNTER — Encounter: Payer: Self-pay | Admitting: Dermatology

## 2019-12-11 ENCOUNTER — Other Ambulatory Visit: Payer: Self-pay

## 2019-12-11 DIAGNOSIS — L821 Other seborrheic keratosis: Secondary | ICD-10-CM | POA: Diagnosis not present

## 2019-12-11 DIAGNOSIS — Z85828 Personal history of other malignant neoplasm of skin: Secondary | ICD-10-CM

## 2019-12-11 NOTE — Patient Instructions (Addendum)
Recommend daily broad spectrum sunscreen SPF 30+ to sun-exposed areas, reapply every 2 hours as needed. Call for new or changing lesions.  Seborrheic Keratosis  What causes seborrheic keratoses? Seborrheic keratoses are harmless, common skin growths that first appear during adult life.  As time goes by, more growths appear.  Some people may develop a large number of them.  Seborrheic keratoses appear on both covered and uncovered body parts.  They are not caused by sunlight.  The tendency to develop seborrheic keratoses can be inherited.  They vary in color from skin-colored to gray, brown, or even black.  They can be either smooth or have a rough, warty surface.   Seborrheic keratoses are superficial and look as if they were stuck on the skin.  Under the microscope this type of keratosis looks like layers upon layers of skin.  That is why at times the top layer may seem to fall off, but the rest of the growth remains and re-grows.    Treatment Seborrheic keratoses do not need to be treated, but can easily be removed in the office.  Seborrheic keratoses often cause symptoms when they rub on clothing or jewelry.  Lesions can be in the way of shaving.  If they become inflamed, they can cause itching, soreness, or burning.  Removal of a seborrheic keratosis can be accomplished by freezing, burning, or surgery. If any spot bleeds, scabs, or grows rapidly, please return to have it checked, as these can be an indication of a skin cancer.  

## 2019-12-11 NOTE — Progress Notes (Signed)
   Follow-Up Visit   Subjective  Nancy Graham is a 49 y.o. female who presents for the following: Follow-up ([place]).  Patient presents today for follow up on OV 06/11/19 for [facility] on scalp, treated with ED&C, recheck today  The following portions of the chart were reviewed this encounter and updated as appropriate:      Review of Systems:  No other skin or systemic complaints except as noted in HPI or Assessment and Plan.  Objective  Well appearing patient in no apparent distress; mood and affect are within normal limits.  A focused examination was performed including Crown. Relevant physical exam findings are noted in the Assessment and Plan.  Objective  Mid Frontal Scalp: Well healed scar with no evidence of recurrence.   Objective  Crown scalp: Waxy flesh papule post to edc site   Assessment & Plan  History of basal cell carcinoma (BCC) Mid Frontal Scalp  Clear. Observe for recurrence. Call clinic for new or changing lesions.  Recommend regular skin exams, daily broad-spectrum spf 30+ sunscreen use, and photoprotection.     Seborrheic keratosis Crown scalp  Reassured benign age-related growth.  Recommend observation.  Discussed cryotherapy if spot(s) become irritated or inflamed.   Return for TBSE as scheduled in January.  Marene Lenz, CMA, am acting as scribe for Brendolyn Patty, MD .  Documentation: I have reviewed the above documentation for accuracy and completeness, and I agree with the above.  Brendolyn Patty MD

## 2020-02-13 DIAGNOSIS — M25511 Pain in right shoulder: Secondary | ICD-10-CM | POA: Diagnosis not present

## 2020-02-19 DIAGNOSIS — M25511 Pain in right shoulder: Secondary | ICD-10-CM | POA: Diagnosis not present

## 2020-02-21 DIAGNOSIS — M25511 Pain in right shoulder: Secondary | ICD-10-CM | POA: Diagnosis not present

## 2020-05-20 ENCOUNTER — Ambulatory Visit (INDEPENDENT_AMBULATORY_CARE_PROVIDER_SITE_OTHER): Payer: BC Managed Care – PPO | Admitting: Dermatology

## 2020-05-20 ENCOUNTER — Other Ambulatory Visit: Payer: Self-pay

## 2020-05-20 DIAGNOSIS — L814 Other melanin hyperpigmentation: Secondary | ICD-10-CM

## 2020-05-20 DIAGNOSIS — D1800 Hemangioma unspecified site: Secondary | ICD-10-CM

## 2020-05-20 DIAGNOSIS — D2239 Melanocytic nevi of other parts of face: Secondary | ICD-10-CM | POA: Diagnosis not present

## 2020-05-20 DIAGNOSIS — L578 Other skin changes due to chronic exposure to nonionizing radiation: Secondary | ICD-10-CM

## 2020-05-20 DIAGNOSIS — D045 Carcinoma in situ of skin of trunk: Secondary | ICD-10-CM

## 2020-05-20 DIAGNOSIS — L821 Other seborrheic keratosis: Secondary | ICD-10-CM

## 2020-05-20 DIAGNOSIS — Z1283 Encounter for screening for malignant neoplasm of skin: Secondary | ICD-10-CM | POA: Diagnosis not present

## 2020-05-20 DIAGNOSIS — D229 Melanocytic nevi, unspecified: Secondary | ICD-10-CM

## 2020-05-20 DIAGNOSIS — D485 Neoplasm of uncertain behavior of skin: Secondary | ICD-10-CM

## 2020-05-20 DIAGNOSIS — L820 Inflamed seborrheic keratosis: Secondary | ICD-10-CM | POA: Diagnosis not present

## 2020-05-20 DIAGNOSIS — Z85828 Personal history of other malignant neoplasm of skin: Secondary | ICD-10-CM

## 2020-05-20 DIAGNOSIS — D210 Benign neoplasm of connective and other soft tissue of head, face and neck: Secondary | ICD-10-CM | POA: Diagnosis not present

## 2020-05-20 DIAGNOSIS — C4492 Squamous cell carcinoma of skin, unspecified: Secondary | ICD-10-CM

## 2020-05-20 HISTORY — DX: Squamous cell carcinoma of skin, unspecified: C44.92

## 2020-05-20 NOTE — Patient Instructions (Signed)

## 2020-05-20 NOTE — Progress Notes (Signed)
Follow-Up Visit   Subjective  Nancy Graham is a 50 y.o. female who presents for the following: Annual Exam (History of BCC and Aks - TBSE today) and Other (Spot on chest that bleeds sometimes and a spot on her chin that is sore).  She also has a growth on her R hairline that gets irritated by her comb.  It stays red and crusty.  The patient presents for Total-Body Skin Exam (TBSE) for skin cancer screening and mole check.   The following portions of the chart were reviewed this encounter and updated as appropriate:       Review of Systems:  No other skin or systemic complaints except as noted in HPI or Assessment and Plan.  Objective  Well appearing patient in no apparent distress; mood and affect are within normal limits.  A full examination was performed including scalp, head, eyes, ears, nose, lips, neck, chest, axillae, abdomen, back, buttocks, bilateral upper extremities, bilateral lower extremities, hands, feet, fingers, toes, fingernails, and toenails. All findings within normal limits unless otherwise noted below.  Objective  Upper sternum: 4 mm irregular brown macule with lighter center       Objective  Mid Chin: 3 mm firm flesh papule     Objective  Left ant shoulder: 9 mm speckled brown macule slightly waxy       Objective  L Crown scalp: Residual stuck-on, waxy, flesh-tan papule --Discussed benign etiology and prognosis.  LN2 last visit  Objective  Right temporal hairline: Erythematous keratotic or waxy stuck-on papule  Objective  Right alar crease: 1.5 mm firm flesh papule   Assessment & Plan    Lentigines - Scattered tan macules - Discussed due to sun exposure - Benign, observe - Call for any changes  Seborrheic Keratoses - Stuck-on, waxy, tan-brown papules and plaques  - Discussed benign etiology and prognosis. - Observe - Call for any changes  Melanocytic Nevi - Tan-brown and/or pink-flesh-colored symmetric macules and  papules - Benign appearing on exam today - Observation - Call clinic for new or changing moles - Recommend daily use of broad spectrum spf 30+ sunscreen to sun-exposed areas.   Hemangiomas - Red papules - Discussed benign nature - Observe - Call for any changes  Actinic Damage - Chronic, secondary to cumulative UV/sun exposure - diffuse scaly erythematous macules with underlying dyspigmentation - Recommend daily broad spectrum sunscreen SPF 30+ to sun-exposed areas, reapply every 2 hours as needed.  - Call for new or changing lesions.  Skin cancer screening performed today.  History of Basal Cell Carcinoma of the Skin - No evidence of recurrence today, r upper arm. Crown scalp not examined, will recheck on f/up - Recommend regular full body skin exams - Recommend daily broad spectrum sunscreen SPF 30+ to sun-exposed areas, reapply every 2 hours as needed.  - Call if any new or changing lesions are noted between office visits   Neoplasm of uncertain behavior of skin (3) Upper sternum  Epidermal / dermal shaving  Lesion diameter (cm):  0.6 Informed consent: discussed and consent obtained   Patient was prepped and draped in usual sterile fashion: Area prepped with alcohol. Anesthesia: the lesion was anesthetized in a standard fashion   Anesthetic:  1% lidocaine w/ epinephrine 1-100,000 buffered w/ 8.4% NaHCO3 Instrument used: flexible razor blade   Hemostasis achieved with: pressure, aluminum chloride and electrodesiccation   Outcome: patient tolerated procedure well   Post-procedure details: wound care instructions given   Post-procedure details comment:  Ointment  and small bandage applied  Specimen 1 - Surgical pathology Differential Diagnosis: Inflamed SK R/O Atypia Check Margins: Yes 4 mm irregular brown macule with lighter center  Mid Chin  Epidermal / dermal shaving  Lesion diameter (cm):  0.5 Informed consent: discussed and consent obtained   Patient was  prepped and draped in usual sterile fashion: Area prepped with alcohol. Anesthesia: the lesion was anesthetized in a standard fashion   Anesthetic:  1% lidocaine w/ epinephrine 1-100,000 buffered w/ 8.4% NaHCO3 Instrument used: flexible razor blade   Hemostasis achieved with: pressure, aluminum chloride and electrodesiccation   Outcome: patient tolerated procedure well   Post-procedure details: wound care instructions given   Post-procedure details comment:  Ointment and small bandage applied  Specimen 2 - Surgical pathology Differential Diagnosis: firm flesh papule Check Margins: Yes 3 mm firm flesh papule  Left ant shoulder  Epidermal / dermal shaving  Lesion diameter (cm):  1.1 Informed consent: discussed and consent obtained   Patient was prepped and draped in usual sterile fashion: Area prepped with alcohol. Anesthesia: the lesion was anesthetized in a standard fashion   Anesthetic:  1% lidocaine w/ epinephrine 1-100,000 buffered w/ 8.4% NaHCO3 Instrument used: flexible razor blade   Hemostasis achieved with: pressure, aluminum chloride and electrodesiccation   Outcome: patient tolerated procedure well   Post-procedure details: wound care instructions given   Post-procedure details comment:  Ointment and small bandage applied  Specimen 3 - Surgical pathology Differential Diagnosis: SK vs Lentigo R/O atypia Check Margins: Yes 9 mm speckled brown macule slightly waxy  Seborrheic keratosis L Crown scalp  SK with some residual - patient declines LN2 treatment today since no longer bothersome  Inflamed seborrheic keratosis Right temporal hairline  Destruction of lesion - Right temporal hairline  Destruction method: cryotherapy   Informed consent: discussed and consent obtained   Timeout:  patient name, date of birth, surgical site, and procedure verified Lesion destroyed using liquid nitrogen: Yes   Region frozen until ice ball extended beyond lesion: Yes   Outcome:  patient tolerated procedure well with no complications   Post-procedure details: wound care instructions given    Fibrous papule of skin Right alar crease  Benign, observe.    Return in about 1 year (around 05/20/2021) for TBSE, and 3-4 wks f/up bxs.  I, Ashok Cordia, CMA, am acting as scribe for Brendolyn Patty, MD .  Documentation: I have reviewed the above documentation for accuracy and completeness, and I agree with the above.  Brendolyn Patty MD

## 2020-05-22 ENCOUNTER — Telehealth: Payer: Self-pay

## 2020-05-22 NOTE — Telephone Encounter (Signed)
Advised pt of bx results 

## 2020-05-22 NOTE — Telephone Encounter (Signed)
-----   Message from Brendolyn Patty, MD sent at 05/21/2020  5:32 PM EST ----- 1. 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 Skin , mid chin PALISADED ENCAPSULATED NEUROMA 3. Skin , left ant shoulder PIGMENTED SEBORRHEIC KERATOSIS  1. SCCIS skin cancer- will treat with EDC at f/up appt 2/9 2. Benign growth of nerve 3. Benign age spot

## 2020-06-18 ENCOUNTER — Ambulatory Visit (INDEPENDENT_AMBULATORY_CARE_PROVIDER_SITE_OTHER): Payer: BC Managed Care – PPO | Admitting: Dermatology

## 2020-06-18 ENCOUNTER — Other Ambulatory Visit: Payer: Self-pay

## 2020-06-18 DIAGNOSIS — Z85858 Personal history of malignant neoplasm of other endocrine glands: Secondary | ICD-10-CM | POA: Diagnosis not present

## 2020-06-18 DIAGNOSIS — D045 Carcinoma in situ of skin of trunk: Secondary | ICD-10-CM

## 2020-06-18 DIAGNOSIS — L578 Other skin changes due to chronic exposure to nonionizing radiation: Secondary | ICD-10-CM

## 2020-06-18 DIAGNOSIS — D099 Carcinoma in situ, unspecified: Secondary | ICD-10-CM

## 2020-06-18 NOTE — Progress Notes (Signed)
   Follow-Up Visit   Subjective  Nancy Graham is a 50 y.o. 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 Note from last visit to recheck [facility] of scalp as well. ).   The following portions of the chart were reviewed this encounter and updated as appropriate:      Review of Systems: No other skin or systemic complaints except as noted in HPI or Assessment and Plan.   Objective  Well appearing patient in no apparent distress; mood and affect are within normal limits.  A focused examination was performed including sternum and scalp. Relevant physical exam findings are noted in the Assessment and Plan.  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  Destruction of lesion  Destruction method: electrodesiccation and curettage   Timeout:  patient name, date of birth, surgical site, and procedure verified Anesthesia: the lesion was anesthetized in a standard fashion   Anesthetic:  1% lidocaine w/ epinephrine 1-100,000 buffered w/ 8.4% NaHCO3 Curettage performed in three different directions: Yes   Electrodesiccation performed over the curetted area: Yes   Final wound size (cm):  0.7 Hemostasis achieved with:  pressure, aluminum chloride and electrodesiccation Outcome: patient tolerated procedure well with no complications   Post-procedure details: wound care instructions given   Additional details:  Mupirocin ointment and Bandaid applied    History of Basal Cell Carcinoma of the Skin Mid frontal scalp - No evidence of recurrence today - Recommend regular full body skin exams - Recommend daily broad spectrum sunscreen SPF 30+ to sun-exposed areas, reapply every 2 hours as needed.  - Call if any new or changing lesions are noted between office visits  Actinic Damage- chest - chronic, secondary to cumulative UV radiation exposure/sun exposure over  time - diffuse scaly erythematous macules with underlying dyspigmentation - Recommend daily broad spectrum sunscreen SPF 30+ to sun-exposed areas, reapply every 2 hours as needed.  - Call for new or changing lesions.  Return in 11 months (on 05/25/2021) for as scheduled.   I, Harriett Sine, CMA, am acting as scribe for Brendolyn Patty, MD.  Documentation: I have reviewed the above documentation for accuracy and completeness, and I agree with the above.  Brendolyn Patty MD

## 2020-06-18 NOTE — Patient Instructions (Signed)

## 2020-06-26 DIAGNOSIS — G8918 Other acute postprocedural pain: Secondary | ICD-10-CM | POA: Diagnosis not present

## 2020-06-26 DIAGNOSIS — M19011 Primary osteoarthritis, right shoulder: Secondary | ICD-10-CM | POA: Diagnosis not present

## 2020-06-26 DIAGNOSIS — M7541 Impingement syndrome of right shoulder: Secondary | ICD-10-CM | POA: Diagnosis not present

## 2020-10-31 IMAGING — MG STEREOTACTIC VACUUM ASSIST RIGHT
8 of 9 series · 8 of 17 positions shown · non-contrast
Comparison: Previous exams.
COMPARISON: Previous exams.

Addendum:
CLINICAL DATA: Patient with indeterminate right breast asymmetry.

EXAM:
RIGHT BREAST STEREOTACTIC CORE NEEDLE BIOPSY

[R (1 of 6)]
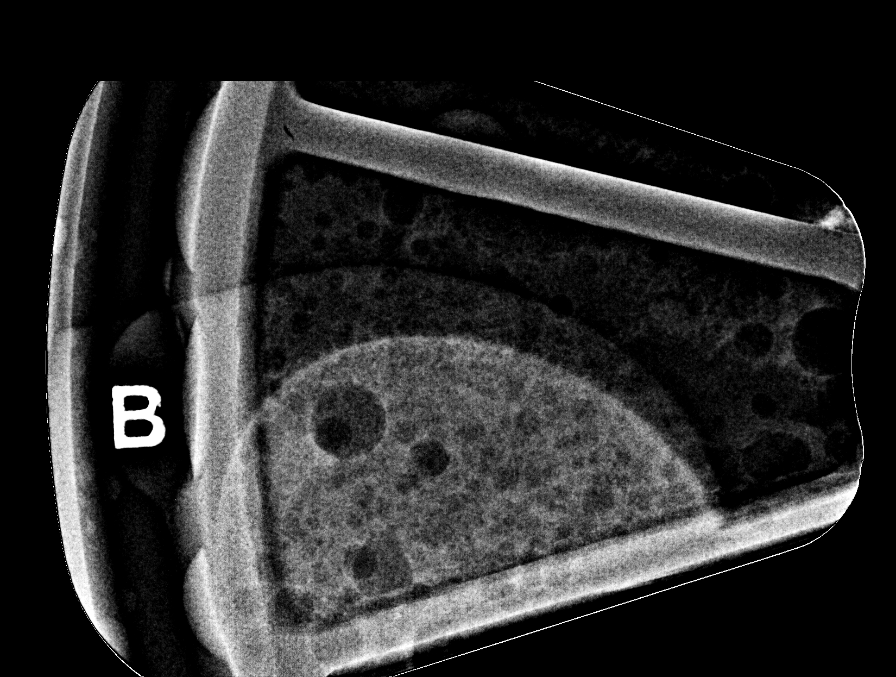

[R (2 of 6)]
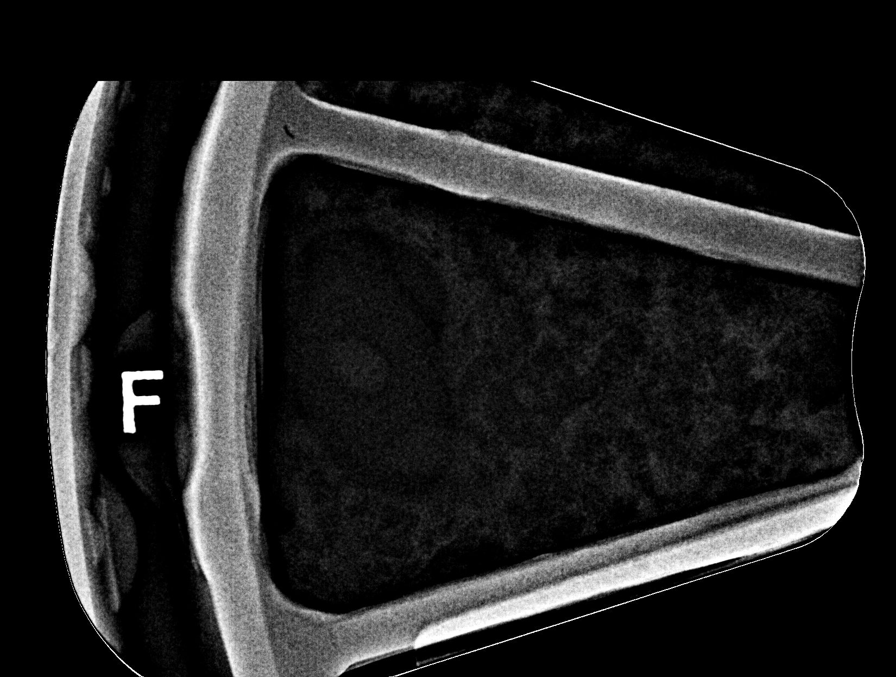

[R (3 of 6)]
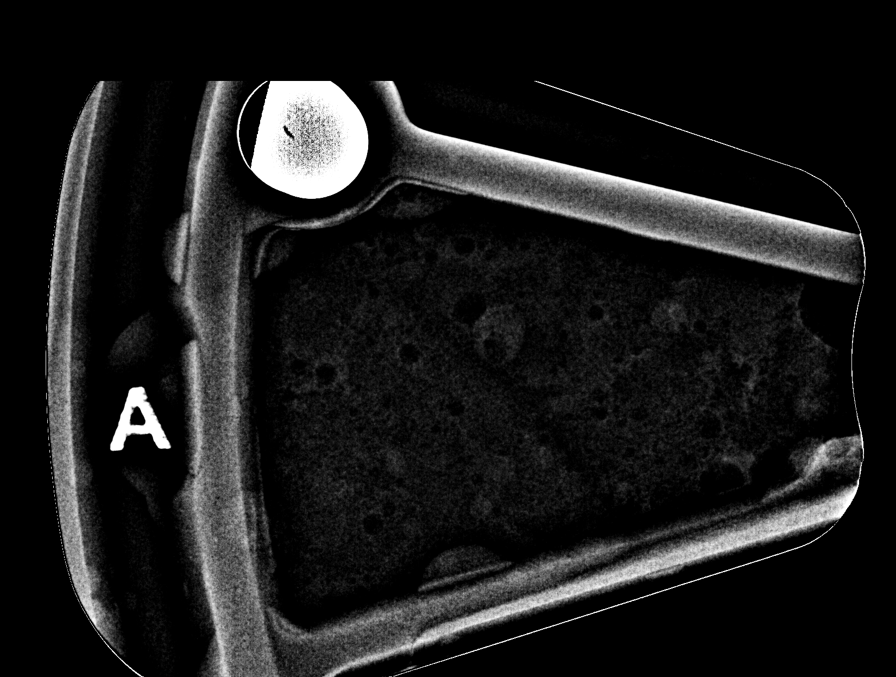

[R (4 of 6)]
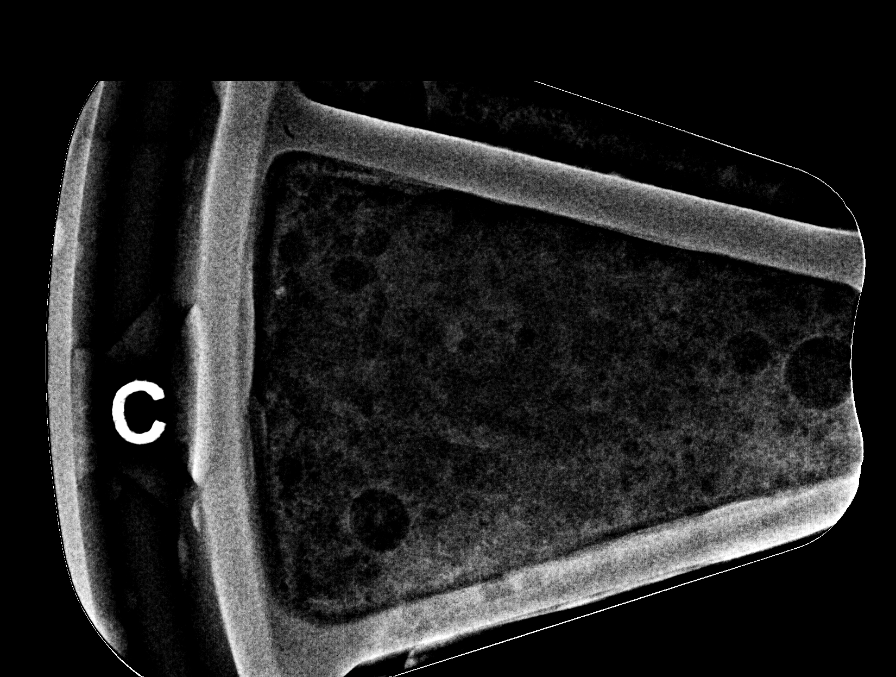

[R (5 of 6)]
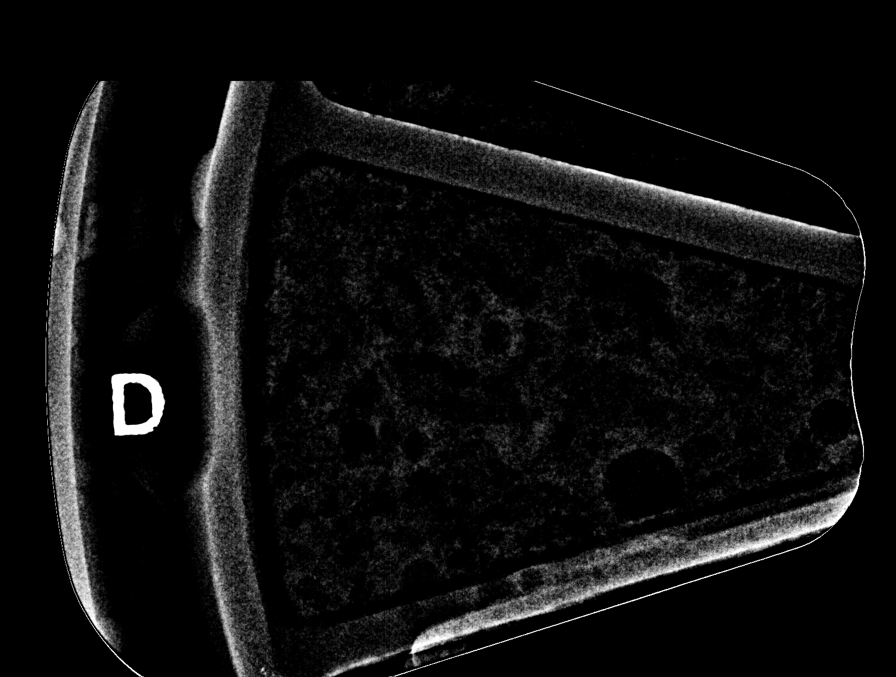

[R (6 of 6)]
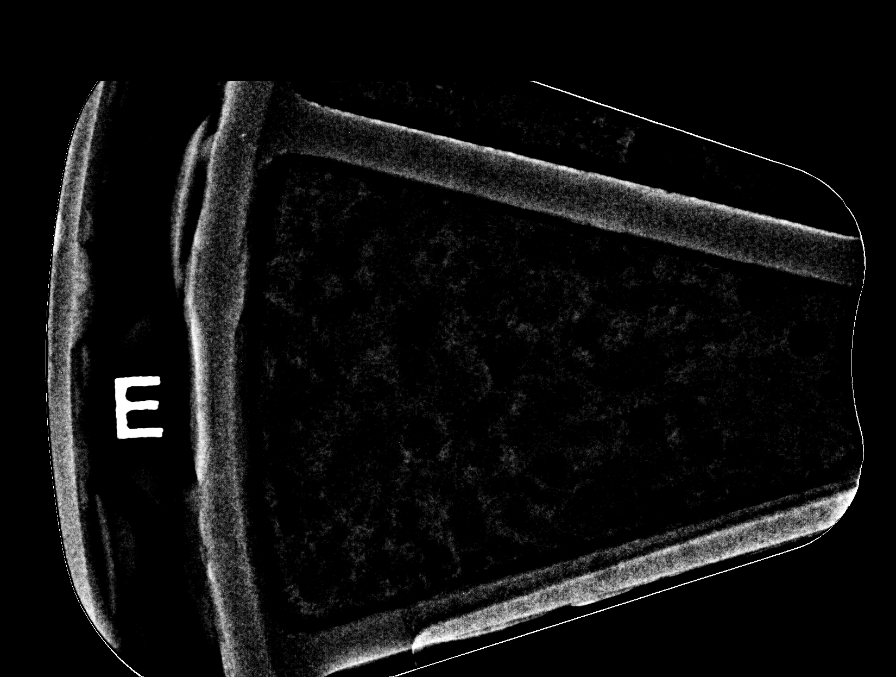

[R LM]
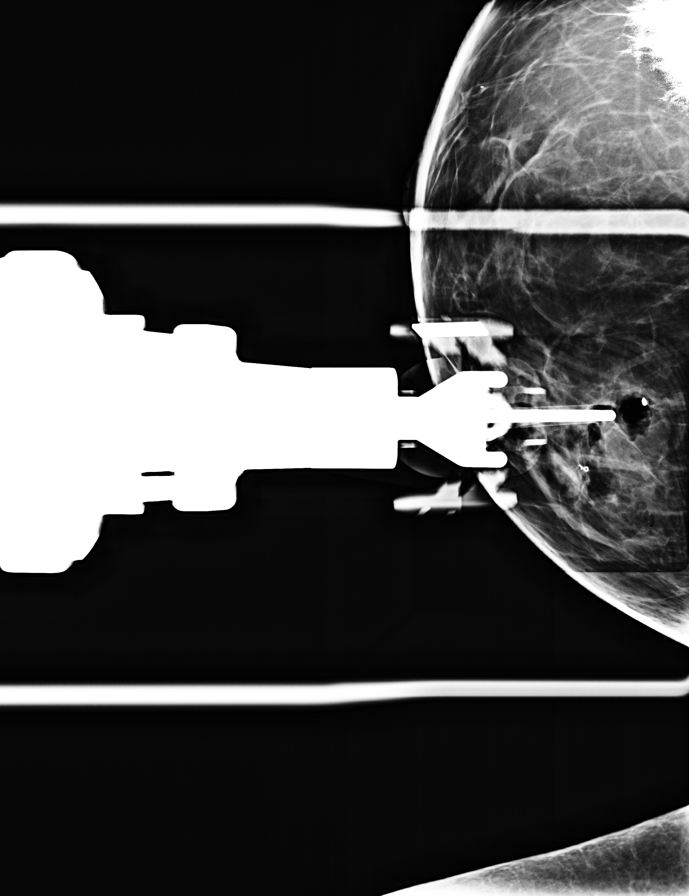

[R LM tomo · tomo slice 32/63.0]
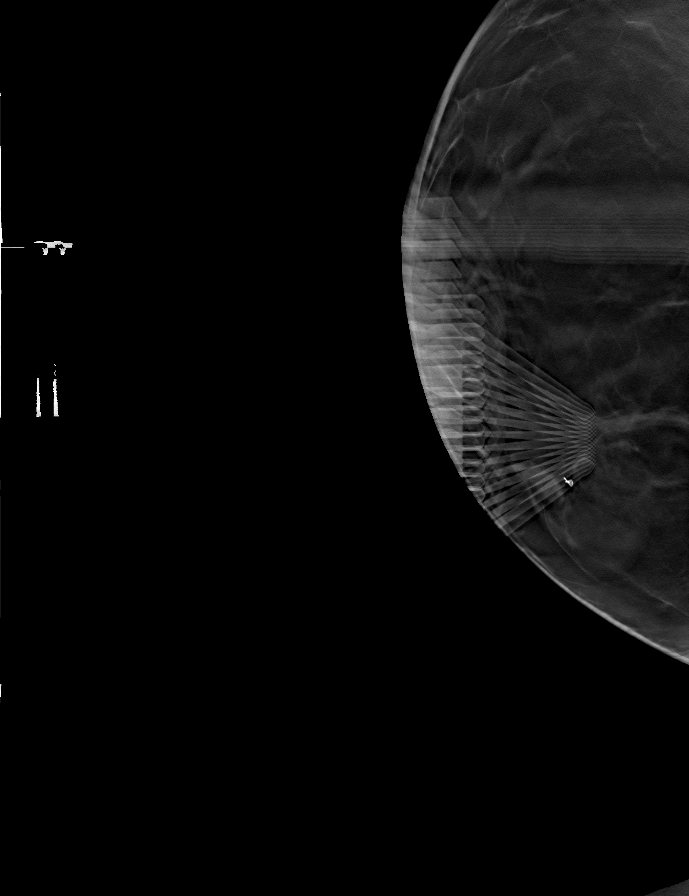

[8 of 17 positions shown; findings below may reference images not displayed]



Using sterile technique and 1% Lidocaine as local anesthetic, under
stereotactic guidance, a 9 gauge vacuum assisted device was used to
perform core needle biopsy of asymmetry within the superior central
right breast using a lateral approach.

Lesion quadrant: Upper inner quadrant

At the conclusion of the procedure, a coil shaped tissue marker clip
was deployed into the biopsy cavity. Follow-up 2-view mammogram was
performed and dictated separately.
IMPRESSION: Stereotactic-guided biopsy of right breast asymmetry. No apparent
complications.

ADDENDUM:
Pathology revealed PSEUDO-ANGIOMATOUS STROMAL HYPERPLASIA (PASH) of
the RIGHT breast, superior central. This was found to be concordant
by Dr. Detsy Moina.

Pathology results were discussed with the patient by telephone. The
patient reported doing well after the biopsy with tenderness, raised
red area around the site. Patient given nurse office number and
instructed to call if worsening of symptoms. Post biopsy
instructions and care were reviewed and questions were answered. The
patient was encouraged to call [REDACTED] for any additional concerns.

The patient was instructed to return for annual screening

Pathology results reported by Huberley Vargas Caro, RN on 11/09/2018.



Using sterile technique and 1% Lidocaine as local anesthetic, under
stereotactic guidance, a 9 gauge vacuum assisted device was used to
perform core needle biopsy of asymmetry within the superior central
right breast using a lateral approach.

Lesion quadrant: Upper inner quadrant

At the conclusion of the procedure, a coil shaped tissue marker clip
was deployed into the biopsy cavity. Follow-up 2-view mammogram was
performed and dictated separately.
IMPRESSION: Stereotactic-guided biopsy of right breast asymmetry. No apparent
complications.

## 2021-01-05 ENCOUNTER — Ambulatory Visit (INDEPENDENT_AMBULATORY_CARE_PROVIDER_SITE_OTHER): Payer: BC Managed Care – PPO

## 2021-01-05 ENCOUNTER — Encounter: Payer: Self-pay | Admitting: Podiatry

## 2021-01-05 ENCOUNTER — Other Ambulatory Visit: Payer: Self-pay

## 2021-01-05 ENCOUNTER — Ambulatory Visit (INDEPENDENT_AMBULATORY_CARE_PROVIDER_SITE_OTHER): Payer: BC Managed Care – PPO | Admitting: Podiatry

## 2021-01-05 DIAGNOSIS — M722 Plantar fascial fibromatosis: Secondary | ICD-10-CM | POA: Diagnosis not present

## 2021-01-05 MED ORDER — TRIAMCINOLONE ACETONIDE 40 MG/ML IJ SUSP
40.0000 mg | Freq: Once | INTRAMUSCULAR | Status: AC
  Administered 2021-01-05: 40 mg

## 2021-01-05 MED ORDER — CELECOXIB 100 MG PO CAPS: 100.0000 mg | ORAL_CAPSULE | Freq: Two times a day (BID) | ORAL | 3 refills | Status: AC

## 2021-01-05 NOTE — Progress Notes (Signed)
Subjective:  Patient ID: Nancy Graham, female    DOB: 07/16/1970,  MRN: [id number] HPI Chief Complaint  Patient presents with   Foot Pain    Patient presents today for bilat medial side heel pain x 1-2 months.  She states "it hurts where the incisions are and its worse on the right foot.  The pain is worse by the end of the day and it burns"    50 y.o. female presents with the above complaint.   ROS: Denies fever chills nausea vomiting muscle aches pains calf pain back pain chest pain shortness of breath.  Past Medical History:  Diagnosis Date   Actinic keratosis 01/05/2008   L cheek    Basal cell carcinoma 05/21/2019   Crown/scalp    Hx of basal cell carcinoma 04/10/2018   R upper arm   Squamous cell carcinoma of skin 05/20/2020   SCC IS upper sternum, EDC 06/18/20   Past Surgical History:  Procedure Laterality Date   FOOT SURGERY Right    epf   right knee surgery      Current Outpatient Medications:    celecoxib (CELEBREX) 100 MG capsule, Take 1 capsule (100 mg total) by mouth 2 (two) times daily., Disp: 60 capsule, Rfl: 3   atorvastatin (LIPITOR) 20 MG tablet, TAKE AS DIRECTED ONCE A DAY FOR CHOLESTEROL ORALLY, Disp: , Rfl: 3   hydrocortisone 2.5 % cream, APPLY ON THE SKIN ONCE TO TWICE DAILY TO AFFECTED AREAS AS NEEDED FOR ITCHY RASH UNTIL CLEAR, Disp: 28.35 g, Rfl: 3   ketoconazole (NIZORAL) 2 % cream, APPLICATIONS APPLY TO RASH BETWEEN BREASTS TWICE DAILY FOR 2 WEEKS, THEN DECREASE TO AT BEDTIME, Disp: 60 g, Rfl: 0   pantoprazole (PROTONIX) 40 MG tablet, Take 40 mg by mouth daily., Disp: , Rfl:    SUMAtriptan (IMITREX) 100 MG tablet, Take by mouth., Disp: , Rfl:   Allergies  Allergen Reactions   Codeine Swelling    Hives    Review of Systems Objective:  There were no vitals filed for this visit.  General: Well developed, nourished, in no acute distress, alert and oriented x3   Dermatological: Skin is warm, dry and supple bilateral. Nails x 10 are well  maintained; remaining integument appears unremarkable at this time. There are no open sores, no preulcerative lesions, no rash or signs of infection present.  Vascular: Dorsalis Pedis artery and Posterior Tibial artery pedal pulses are 2/4 bilateral with immedate capillary fill time. Pedal hair growth present. No varicosities and no lower extremity edema present bilateral.   Neruologic: Grossly intact via light touch bilateral. Vibratory intact via tuning fork bilateral. Protective threshold with Semmes Wienstein monofilament intact to all pedal sites bilateral. Patellar and Achilles deep tendon reflexes 2+ bilateral. No Babinski or clonus noted bilateral.   Musculoskeletal: No gross boney pedal deformities bilateral. No pain, crepitus, or limitation noted with foot and ankle range of motion bilateral. Muscular strength 5/5 in all groups tested bilateral.  Mild pain on palpation medial calcaneal tubercles bilateral.  Gait: Unassisted, Nonantalgic.    Radiographs:  Radiographs taken today demonstrate an osseously mature individual soft tissue increase in density plantar fashion calcaneal insertion site with plantar calcaneal heel spurs bilaterally right greater than left.  Assessment & Plan:   Assessment: Planter fasciitis right greater than left.  Plan: Discussed etiology pathology conservative surgical therapies at this point time we will start her back on her Celebrex 100 mg twice a day and injected her bilateral heels with 20 mg  Kenalog 5 mg Marcaine point maximal tenderness.  Placed her in plantar fascial braces bilateral once again discussed appropriate shoe gear stretching exercises and ice therapy.     Queena Monrreal T. [place], [place]

## 2021-01-25 ENCOUNTER — Other Ambulatory Visit: Payer: Self-pay | Admitting: Dermatology

## 2021-02-04 ENCOUNTER — Ambulatory Visit (INDEPENDENT_AMBULATORY_CARE_PROVIDER_SITE_OTHER): Payer: BC Managed Care – PPO | Admitting: Podiatry

## 2021-02-04 ENCOUNTER — Encounter: Payer: Self-pay | Admitting: Podiatry

## 2021-02-04 ENCOUNTER — Other Ambulatory Visit: Payer: Self-pay

## 2021-02-04 DIAGNOSIS — M722 Plantar fascial fibromatosis: Secondary | ICD-10-CM

## 2021-02-04 MED ORDER — TRIAMCINOLONE ACETONIDE 40 MG/ML IJ SUSP
40.0000 mg | Freq: Once | INTRAMUSCULAR | Status: AC
  Administered 2021-02-04: 40 mg

## 2021-02-04 NOTE — Progress Notes (Signed)
She presents stating that both of her feet have improved so much she is very happy with the outcome thus far but states that there is just a little bit of pain left and she would like another set of injections if possible.  Objective: Vital signs are stable alert oriented x3.  She has pain on palpation medial calcaneal tubercles bilateral much decreased in symptomatology from previous evaluations.  Assessment: Planter fasciitis resolving bilateral.  Plan: Reinjected bilateral heels today follow-up with her in 6 weeks if necessary.

## 2021-03-13 DIAGNOSIS — N6489 Other specified disorders of breast: Secondary | ICD-10-CM | POA: Insufficient documentation

## 2021-03-13 DIAGNOSIS — G43829 Menstrual migraine, not intractable, without status migrainosus: Secondary | ICD-10-CM | POA: Insufficient documentation

## 2021-03-13 DIAGNOSIS — E785 Hyperlipidemia, unspecified: Secondary | ICD-10-CM | POA: Insufficient documentation

## 2021-03-13 DIAGNOSIS — N3281 Overactive bladder: Secondary | ICD-10-CM | POA: Insufficient documentation

## 2021-03-16 DIAGNOSIS — Z1231 Encounter for screening mammogram for malignant neoplasm of breast: Secondary | ICD-10-CM | POA: Diagnosis not present

## 2021-03-16 DIAGNOSIS — Z6841 Body Mass Index (BMI) 40.0 and over, adult: Secondary | ICD-10-CM | POA: Diagnosis not present

## 2021-03-16 DIAGNOSIS — Z01419 Encounter for gynecological examination (general) (routine) without abnormal findings: Secondary | ICD-10-CM | POA: Diagnosis not present

## 2021-03-18 ENCOUNTER — Encounter: Payer: BC Managed Care – PPO | Admitting: Podiatry

## 2021-04-29 ENCOUNTER — Other Ambulatory Visit: Payer: Self-pay | Admitting: Dermatology

## 2021-05-20 DIAGNOSIS — Z13820 Encounter for screening for osteoporosis: Secondary | ICD-10-CM | POA: Diagnosis not present

## 2021-05-25 ENCOUNTER — Other Ambulatory Visit: Payer: Self-pay

## 2021-05-25 ENCOUNTER — Ambulatory Visit: Payer: Federal, State, Local not specified - PPO | Admitting: Dermatology

## 2021-05-25 ENCOUNTER — Encounter: Payer: Self-pay | Admitting: Dermatology

## 2021-05-25 DIAGNOSIS — L820 Inflamed seborrheic keratosis: Secondary | ICD-10-CM

## 2021-05-25 DIAGNOSIS — D2239 Melanocytic nevi of other parts of face: Secondary | ICD-10-CM

## 2021-05-25 DIAGNOSIS — S20311A Abrasion of right front wall of thorax, initial encounter: Secondary | ICD-10-CM

## 2021-05-25 DIAGNOSIS — L578 Other skin changes due to chronic exposure to nonionizing radiation: Secondary | ICD-10-CM

## 2021-05-25 DIAGNOSIS — T148XXA Other injury of unspecified body region, initial encounter: Secondary | ICD-10-CM

## 2021-05-25 DIAGNOSIS — Z1283 Encounter for screening for malignant neoplasm of skin: Secondary | ICD-10-CM

## 2021-05-25 DIAGNOSIS — D229 Melanocytic nevi, unspecified: Secondary | ICD-10-CM

## 2021-05-25 DIAGNOSIS — L719 Rosacea, unspecified: Secondary | ICD-10-CM

## 2021-05-25 DIAGNOSIS — L821 Other seborrheic keratosis: Secondary | ICD-10-CM

## 2021-05-25 DIAGNOSIS — L814 Other melanin hyperpigmentation: Secondary | ICD-10-CM

## 2021-05-25 MED ORDER — IVERMECTIN 1 % EX CREA: TOPICAL_CREAM | CUTANEOUS | 5 refills | Status: DC

## 2021-05-25 MED ORDER — EUCRISA 2 % EX OINT: TOPICAL_OINTMENT | CUTANEOUS | 1 refills | Status: DC

## 2021-05-25 NOTE — Progress Notes (Signed)
Follow-Up Visit   Subjective  Nancy Graham is a 51 y.o. female who presents for the following: Annual Exam.  Patient presents for TBSE. The patient has spots, moles and lesions to be evaluated, some may be new or changing. She has some itchy irritated growths on the face. She has a history of SCC in situ of the upper sternum treated with EDC at last visit. She has a history of BCC of the crown scalp and right upper arm. She also has developed redness and itching of the face. She is using 1% hydrocortisone cream for itch.   The following portions of the chart were reviewed this encounter and updated as appropriate:       Review of Systems:  No other skin or systemic complaints except as noted in HPI or Assessment and Plan.  Objective  Well appearing patient in no apparent distress; mood and affect are within normal limits.  A full examination was performed including scalp, head, eyes, ears, nose, lips, neck, chest, axillae, abdomen, back, buttocks, bilateral upper extremities, bilateral lower extremities, hands, feet, fingers, toes, fingernails, and toenails. All findings within normal limits unless otherwise noted below.  R alar crease 1.5 mm firm flesh papule  Right Chest Pink crusted macule  L forehead x 1, L cheek x 1 (2) Erythematous stuck-on, waxy papule  Left Medial Cheek 3.0 mm pink flesh papule, pt states itches at times  face Erythema with telangiectasias of the cheeks, nose, chin and small pink papules    Assessment & Plan  Skin cancer screening performed today.  Actinic Damage - chronic, secondary to cumulative UV radiation exposure/sun exposure over time - diffuse scaly erythematous macules with underlying dyspigmentation - Recommend daily broad spectrum sunscreen SPF 30+ to sun-exposed areas, reapply every 2 hours as needed.  - Recommend staying in the shade or wearing long sleeves, sun glasses (UVA+UVB protection) and wide brim hats (4-inch brim  around the entire circumference of the hat). - Call for new or changing lesions.  Lentigines - Scattered tan macules - Due to sun exposure - Benign-appering, observe - Recommend daily broad spectrum sunscreen SPF 30+ to sun-exposed areas, reapply every 2 hours as needed. - Call for any changes  Seborrheic Keratoses - Stuck-on, waxy, tan-brown papules and/or plaques  - Benign-appearing - Discussed benign etiology and prognosis. - Observe - Call for any changes  Melanocytic Nevi - Tan-brown and/or pink-flesh-colored symmetric macules and papules - Benign appearing on exam today - Observation - Call clinic for new or changing moles - Recommend daily use of broad spectrum spf 30+ sunscreen to sun-exposed areas.   Fibrous papule of nose R alar crease  Stable. [place], Observe.   Excoriation Right Chest  Healing, secondary to dog scratch. Observe  Inflamed seborrheic keratosis (2) L forehead x 1, L cheek x 1  Destruction of lesion - L forehead x 1, L cheek x 1  Destruction method: cryotherapy   Informed consent: discussed and consent obtained   Lesion destroyed using liquid nitrogen: Yes   Region frozen until ice ball extended beyond lesion: Yes   Outcome: patient tolerated procedure well with no complications   Post-procedure details: wound care instructions given   Additional details:  Prior to procedure, discussed risks of blister formation, small wound, skin dyspigmentation, or rare scar following cryotherapy. Recommend Vaseline ointment to treated areas while healing.   Nevus Left Medial Cheek  Benign-appearing.  Observation.  Call clinic for new or changing moles.  Recommend daily use of  broad spectrum spf 30+ sunscreen to sun-exposed areas.   Discussed shave removal since itching.  Pt defers at this time.  Rosacea face  Flare with pruritus  Rosacea is a chronic progressive skin condition usually affecting the face of adults, causing redness and/or acne  bumps. It is treatable but not curable. It sometimes affects the eyes (ocular rosacea) as well. It may respond to topical and/or systemic medication and can flare with stress, sun exposure, alcohol, exercise and some foods.  Daily application of broad spectrum spf 30+ sunscreen to face is recommended to reduce flares.  Start Soolantra Cream Apply to face qhs dsp 45g 5Rf.  Start Eucrisa Ointment Apply to face qd/bid prn itch dsp 60g 1Rf.   Ivermectin (SOOLANTRA) 1 % CREA - face Apply to face every night for rosacea.  Crisaborole (EUCRISA) 2 % OINT - face Apply to face 1-2 times a day as needed for itch.   Return in about 2 months (around 07/23/2021) for rosacea.  IJamesetta Orleans, CMA, am acting as scribe for Brendolyn Patty, MD .  Documentation: I have reviewed the above documentation for accuracy and completeness, and I agree with the above.  Brendolyn Patty MD

## 2021-05-25 NOTE — Patient Instructions (Addendum)
Start Soolantra Cream - apply to face every night for rosacea.  Start Eucrisa Ointment - apply to face 1-2 times a day as needed for itch.   Rosacea  What is rosacea? Rosacea (say: ro-zay-sha) is a common skin disease that usually begins as a trend of flushing or blushing easily.  As rosacea progresses, a persistent redness in the center of the face will develop and may gradually spread beyond the nose and cheeks to the forehead and chin.  In some cases, the ears, chest, and back could be affected.  Rosacea may appear as tiny blood vessels or small red bumps that occur in crops.  Frequently they can contain pus, and are called pustules.  If the bumps do not contain pus, they are referred to as papules.  Rarely, in prolonged, untreated cases of rosacea, the oil glands of the nose and cheeks may become permanently enlarged.  This is called rhinophyma, and is seen more frequently in men.  Signs and Risks In its beginning stages, rosacea tends to come and go, which makes it difficult to recognize.  It can start as intermittent flushing of the face.  Eventually, blood vessels may become permanently visible.  Pustules and papules can appear, but can be mistaken for adult acne.  People of all races, ages, genders and ethnic groups are at risk of developing rosacea.  However, it is more common in women (especially around menopause) and adults with fair skin between the ages of 26 and 37.  Treatment Dermatologists typically recommend a combination of treatments to effectively manage rosacea.  Treatment can improve symptoms and may stop the progression of the rosacea.  Treatment may involve both topical and oral medications.  The tetracycline antibiotics are often used for their anti-inflammatory effect; however, because of the possibility of developing antibiotic resistance, they should not be used long term at full dose.  For dilated blood vessels the options include electrodessication (uses electric current  through a small needle), laser treatment, and cosmetics to hide the redness.   With all forms of treatment, improvement is a slow process, and patients may not see any results for the first 3-4 weeks.  It is very important to avoid the sun and other triggers.  Patients must wear sunscreen daily.  Skin Care Instructions: Cleanse the skin with a mild soap such as CeraVe cleanser, Cetaphil cleanser, or Dove soap once or twice daily as needed. Moisturize with Eucerin Redness Relief Daily Perfecting Lotion (has a subtle green tint), CeraVe Moisturizing Cream, or Oil of Olay Daily Moisturizer with sunscreen every morning and/or night as recommended. Makeup should be non-comedogenic (wont clog pores) and be labeled for sensitive skin. Good choices for cosmetics are: Neutrogena, Almay, and Physicians Formula.  Any product with a green tint tends to offset a red complexion. If your eyes are dry and irritated, use artificial tears 2-3 times per day and cleanse the eyelids daily with baby shampoo.  Have your eyes examined at least every 2 years.  Be sure to tell your eye doctor that you have rosacea. Alcoholic beverages tend to cause flushing of the skin, and may make rosacea worse. Always wear sunscreen, protect your skin from extreme hot and cold temperatures, and avoid spicy foods, hot drinks, and mechanical irritation such as rubbing, scrubbing, or massaging the face.  Avoid harsh skin cleansers, cleansing masks, astringents, and exfoliation. If a particular product burns or makes your face feel tight, then it is likely to flare your rosacea. If you are having  difficulty finding a sunscreen that you can tolerate, you may try switching to a chemical-free sunscreen.  These are ones whose active ingredient is zinc oxide or titanium dioxide only.  They should also be fragrance free, non-comedogenic, and labeled for sensitive skin. Rosacea triggers may vary from person to person.  There are a variety of foods  that have been reported to trigger rosacea.  Some patients find that keeping a diary of what they were doing when they flared helps them avoid triggers.   Cryotherapy Aftercare  Wash gently with soap and water everyday.   Apply Vaseline and Band-Aid daily until healed.     If You Need Anything After Your Visit  If you have any questions or concerns for your doctor, please call our main line at [phone] and press option 4 to reach your doctor's medical assistant. If no one answers, please leave a voicemail as directed and we will return your call as soon as possible. Messages left after 4 pm will be answered the following business day.   You may also send [place] a message via [place]. We typically respond to MyChart messages within 1-2 business days.  For prescription refills, please ask your pharmacy to contact our office. Our fax number is [phone].  If you have an urgent issue when the clinic is closed that cannot wait until the next business day, you can page your doctor at the number below.    Please note that while we do our best to be available for urgent issues outside of office hours, we are not available 24/7.   If you have an urgent issue and are unable to reach [place], you may choose to seek medical care at your doctor's office, retail clinic, urgent care center, or emergency room.  If you have a medical emergency, please immediately call 911 or go to the emergency department.  Pager Numbers  - Dr. Nehemiah Massed: [phone]  - Dr. Laurence Ferrari: [phone]  - Dr. Nicole Kindred: [phone]  In the event of inclement weather, please call our main line at [phone] for an update on the status of any delays or closures.  Dermatology Medication Tips: Please keep the boxes that topical medications come in in order to help keep track of the instructions about where and how to use these. Pharmacies typically print the medication instructions only on the boxes and not directly on the  medication tubes.   If your medication is too expensive, please contact our office at [phone] option 4 or send [place] a message through [place].   We are unable to tell what your co-pay for medications will be in advance as this is different depending on your insurance coverage. However, we may be able to find a substitute medication at lower cost or fill out paperwork to get insurance to cover a needed medication.   If a prior authorization is required to get your medication covered by your insurance company, please allow [place] 1-2 business days to complete this process.  Drug prices often vary depending on where the prescription is filled and some pharmacies may offer cheaper prices.  The website www.goodrx.com contains coupons for medications through different pharmacies. The prices here do not account for what the cost may be with help from insurance (it may be cheaper with your insurance), but the website can give you the price if you did not use any insurance.  - You can print the associated coupon and take it with your prescription to the pharmacy.  - You may  also stop by our office during regular business hours and pick up a GoodRx coupon card.  - If you need your prescription sent electronically to a different pharmacy, notify our office through [facility] or by phone at [phone] option 4.     Si Usted Necesita Algo Despus de Su Visita  Tambin puede enviarnos un mensaje a travs de Pharmacist, community. Por lo general respondemos a los mensajes de MyChart en el transcurso de 1 a 2 das hbiles.  Para renovar recetas, por favor pida a su farmacia que se ponga en contacto con nuestra oficina. Harland Dingwall de fax es [place] [phone].  Si tiene un asunto urgente cuando la clnica est cerrada y que no puede esperar hasta el siguiente da hbil, puede llamar/localizar a su doctor(a) al nmero que aparece a continuacin.   Por favor, tenga en cuenta que aunque hacemos todo lo posible  para estar disponibles para asuntos urgentes fuera del horario de [place], no estamos disponibles las 24 horas del da, los 7 das de la [place].   Si tiene un problema urgente y no puede comunicarse con nosotros, puede optar por buscar atencin mdica  en el consultorio de su doctor(a), en una clnica privada, en un centro de atencin urgente o en una sala de emergencias.  Si tiene Engineering geologist, por favor llame inmediatamente al 911 o vaya a la sala de emergencias.  Nmeros de bper  - Dr. Nehemiah Massed: [phone]  - Dra. Moye: [phone]  - Dra. Nicole Kindred: [phone]  En caso de inclemencias del [place], por favor llame a Johnsie Kindred principal al [phone] para una actualizacin sobre el [place] de cualquier retraso o cierre.  Consejos para la medicacin en dermatologa: Por favor, guarde las cajas en las que vienen los medicamentos de uso tpico para ayudarle a seguir las instrucciones sobre dnde y cmo usarlos. Las farmacias generalmente imprimen las instrucciones del medicamento slo en las cajas y no directamente en los tubos del [place].   Si su medicamento es muy caro, por favor, pngase en contacto con Zigmund Daniel llamando al [phone] y presione la opcin 4 o envenos un mensaje a travs de Pharmacist, community.   No podemos decirle cul ser su copago por los medicamentos por adelantado ya que esto es diferente dependiendo de la cobertura de su seguro. Sin embargo, es posible que podamos encontrar un medicamento sustituto a Electrical engineer un formulario para que el seguro cubra el medicamento que se considera necesario.   Si se requiere una autorizacin previa para que su compaa de seguros [place] su medicamento, por favor permtanos de 1 a 2 das hbiles para completar este proceso.  Los precios de los medicamentos varan con frecuencia dependiendo del Environmental consultant de dnde se surte la receta y alguna farmacias pueden ofrecer precios ms baratos.  El sitio web  www.goodrx.com tiene cupones para medicamentos de Airline pilot. Los precios aqu no tienen en cuenta lo que podra costar con la ayuda del seguro (puede ser ms barato con su seguro), pero el sitio web puede darle el precio si no utiliz Research scientist (physical sciences).  - Puede imprimir el cupn correspondiente y llevarlo con su receta a la farmacia.  - Tambin puede pasar por nuestra oficina durante el horario de atencin regular y Charity fundraiser una tarjeta de cupones de GoodRx.  - Si necesita que su receta se enve electrnicamente a Chiropodist, informe a nuestra oficina a travs de MyChart de [place] o por telfono llamando al [phone] y presione la  opcin 4.

## 2021-05-26 ENCOUNTER — Telehealth: Payer: Self-pay

## 2021-05-26 DIAGNOSIS — L719 Rosacea, unspecified: Secondary | ICD-10-CM

## 2021-05-26 NOTE — Telephone Encounter (Addendum)
Soolantra denied by pt insurance. Pt must try and fail doxycycline or generic metronidazole.   Nancy Graham also denied by insurance. No alternatives were provided.  Please advise.

## 2021-05-27 MED ORDER — PIMECROLIMUS 1 % EX CREA: TOPICAL_CREAM | CUTANEOUS | 1 refills | Status: DC

## 2021-05-27 MED ORDER — METRONIDAZOLE 0.75 % EX CREA: TOPICAL_CREAM | CUTANEOUS | 5 refills | Status: DC

## 2021-05-27 NOTE — Addendum Note (Signed)
Addended by: Harriett Sine on: 05/27/2021 09:59 AM   Modules accepted: Orders

## 2021-05-27 NOTE — Telephone Encounter (Signed)
Pt notified of changes and alternatives sent to pharmacy.

## 2021-06-10 DIAGNOSIS — K5730 Diverticulosis of large intestine without perforation or abscess without bleeding: Secondary | ICD-10-CM | POA: Diagnosis not present

## 2021-06-10 DIAGNOSIS — Z1211 Encounter for screening for malignant neoplasm of colon: Secondary | ICD-10-CM | POA: Diagnosis not present

## 2021-06-10 DIAGNOSIS — D120 Benign neoplasm of cecum: Secondary | ICD-10-CM | POA: Diagnosis not present

## 2021-07-20 ENCOUNTER — Other Ambulatory Visit: Payer: Self-pay | Admitting: Dermatology

## 2021-08-03 ENCOUNTER — Ambulatory Visit: Payer: Federal, State, Local not specified - PPO | Admitting: Dermatology

## 2021-08-31 DIAGNOSIS — G5602 Carpal tunnel syndrome, left upper limb: Secondary | ICD-10-CM | POA: Diagnosis not present

## 2021-08-31 DIAGNOSIS — G5601 Carpal tunnel syndrome, right upper limb: Secondary | ICD-10-CM | POA: Diagnosis not present

## 2021-09-07 DIAGNOSIS — R200 Anesthesia of skin: Secondary | ICD-10-CM | POA: Diagnosis not present

## 2021-09-16 DIAGNOSIS — G5601 Carpal tunnel syndrome, right upper limb: Secondary | ICD-10-CM | POA: Diagnosis not present

## 2021-09-16 DIAGNOSIS — G5602 Carpal tunnel syndrome, left upper limb: Secondary | ICD-10-CM | POA: Diagnosis not present

## 2021-09-16 DIAGNOSIS — G5603 Carpal tunnel syndrome, bilateral upper limbs: Secondary | ICD-10-CM | POA: Diagnosis not present

## 2021-09-17 DIAGNOSIS — Z79899 Other long term (current) drug therapy: Secondary | ICD-10-CM | POA: Diagnosis not present

## 2021-09-17 DIAGNOSIS — G43909 Migraine, unspecified, not intractable, without status migrainosus: Secondary | ICD-10-CM | POA: Diagnosis not present

## 2021-09-17 DIAGNOSIS — E7800 Pure hypercholesterolemia, unspecified: Secondary | ICD-10-CM | POA: Diagnosis not present

## 2021-09-17 DIAGNOSIS — K219 Gastro-esophageal reflux disease without esophagitis: Secondary | ICD-10-CM | POA: Diagnosis not present

## 2021-09-21 DIAGNOSIS — G5601 Carpal tunnel syndrome, right upper limb: Secondary | ICD-10-CM | POA: Diagnosis not present

## 2021-12-16 ENCOUNTER — Ambulatory Visit: Payer: Federal, State, Local not specified - PPO | Admitting: Podiatry

## 2021-12-16 DIAGNOSIS — M722 Plantar fascial fibromatosis: Secondary | ICD-10-CM

## 2021-12-16 MED ORDER — TRIAMCINOLONE ACETONIDE 40 MG/ML IJ SUSP
20.0000 mg | Freq: Once | INTRAMUSCULAR | Status: AC
  Administered 2021-12-16: 20 mg

## 2021-12-16 NOTE — Progress Notes (Signed)
She presents today for follow-up of her Planter fasciitis states the left foot is starting to flareup again.  Objective: Vital signs are stable alert and oriented x 3.  Pulses are palpable.  There is no erythema edema salines drainage or odor she has pain to palpation medial calcaneal tubercle of the right heel.  Assessment: Planter fasciitis of the right heel.  Plan: Reinjected the right heel today 20 mg Kenalog 5 mg Marcaine point of maximal tenderness.  Tolerated procedure well follow-up with her as needed

## 2021-12-17 DIAGNOSIS — D179 Benign lipomatous neoplasm, unspecified: Secondary | ICD-10-CM | POA: Diagnosis not present

## 2022-03-03 DIAGNOSIS — R0781 Pleurodynia: Secondary | ICD-10-CM | POA: Diagnosis not present

## 2022-03-23 DIAGNOSIS — Z01419 Encounter for gynecological examination (general) (routine) without abnormal findings: Secondary | ICD-10-CM | POA: Diagnosis not present

## 2022-03-23 DIAGNOSIS — Z7689 Persons encountering health services in other specified circumstances: Secondary | ICD-10-CM | POA: Diagnosis not present

## 2022-03-23 DIAGNOSIS — Z1231 Encounter for screening mammogram for malignant neoplasm of breast: Secondary | ICD-10-CM | POA: Diagnosis not present

## 2022-04-26 DIAGNOSIS — H9203 Otalgia, bilateral: Secondary | ICD-10-CM | POA: Diagnosis not present

## 2022-04-26 DIAGNOSIS — R0981 Nasal congestion: Secondary | ICD-10-CM | POA: Diagnosis not present

## 2022-05-17 DIAGNOSIS — H9202 Otalgia, left ear: Secondary | ICD-10-CM | POA: Diagnosis not present

## 2022-05-25 ENCOUNTER — Ambulatory Visit: Payer: Federal, State, Local not specified - PPO | Admitting: Dermatology

## 2022-05-25 DIAGNOSIS — L814 Other melanin hyperpigmentation: Secondary | ICD-10-CM | POA: Diagnosis not present

## 2022-05-25 DIAGNOSIS — D2239 Melanocytic nevi of other parts of face: Secondary | ICD-10-CM

## 2022-05-25 DIAGNOSIS — Z85828 Personal history of other malignant neoplasm of skin: Secondary | ICD-10-CM

## 2022-05-25 DIAGNOSIS — L719 Rosacea, unspecified: Secondary | ICD-10-CM | POA: Diagnosis not present

## 2022-05-25 DIAGNOSIS — D229 Melanocytic nevi, unspecified: Secondary | ICD-10-CM

## 2022-05-25 DIAGNOSIS — Z1283 Encounter for screening for malignant neoplasm of skin: Secondary | ICD-10-CM

## 2022-05-25 DIAGNOSIS — L578 Other skin changes due to chronic exposure to nonionizing radiation: Secondary | ICD-10-CM

## 2022-05-25 DIAGNOSIS — D225 Melanocytic nevi of trunk: Secondary | ICD-10-CM

## 2022-05-25 DIAGNOSIS — L918 Other hypertrophic disorders of the skin: Secondary | ICD-10-CM

## 2022-05-25 DIAGNOSIS — Z86007 Personal history of in-situ neoplasm of skin: Secondary | ICD-10-CM

## 2022-05-25 DIAGNOSIS — L821 Other seborrheic keratosis: Secondary | ICD-10-CM

## 2022-05-25 NOTE — Patient Instructions (Addendum)
     Melanoma ABCDEs  Melanoma is the most dangerous type of skin cancer, and is the leading cause of death from skin disease.  You are more likely to develop melanoma if you: Have light-colored skin, light-colored eyes, or red or blond hair Spend a lot of time in the sun Tan regularly, either outdoors or in a tanning bed Have had blistering sunburns, especially during childhood Have a close family member who has had a melanoma Have atypical moles or large birthmarks  Early detection of melanoma is key since treatment is typically straightforward and cure rates are extremely high if we catch it early.   The first sign of melanoma is often a change in a mole or a new dark spot.  The ABCDE system is a way of remembering the signs of melanoma.  A for asymmetry:  The two halves do not match. B for border:  The edges of the growth are irregular. C for color:  A mixture of colors are present instead of an even brown color. D for diameter:  Melanomas are usually (but not always) greater than 6mm - the size of a pencil eraser. E for evolution:  The spot keeps changing in size, shape, and color.  Please check your skin once per month between visits. You can use a small mirror in front and a large mirror behind you to keep an eye on the back side or your body.   If you see any new or changing lesions before your next follow-up, please call to schedule a visit.  Please continue daily skin protection including broad spectrum sunscreen SPF 30+ to sun-exposed areas, reapplying every 2 hours as needed when you're outdoors.   Staying in the shade or wearing long sleeves, sun glasses (UVA+UVB protection) and wide brim hats (4-inch brim around the entire circumference of the hat) are also recommended for sun protection.    Due to recent changes in healthcare laws, you may see results of your pathology and/or laboratory studies on MyChart before the doctors have had a chance to review them. We  understand that in some cases there may be results that are confusing or concerning to you. Please understand that not all results are received at the same time and often the doctors may need to interpret multiple results in order to provide you with the best plan of care or course of treatment. Therefore, we ask that you please give us 2 business days to thoroughly review all your results before contacting the office for clarification. Should we see a critical lab result, you will be contacted sooner.   If You Need Anything After Your Visit  If you have any questions or concerns for your doctor, please call our main line at [phone] and press option 4 to reach your doctor's medical assistant. If no one answers, please leave a voicemail as directed and we will return your call as soon as possible. Messages left after 4 pm will be answered the following business day.   You may also send us a message via MyChart. We typically respond to MyChart messages within 1-2 business days.  For prescription refills, please ask your pharmacy to contact our office. Our fax number is [phone].  If you have an urgent issue when the clinic is closed that cannot wait until the next business day, you can page your doctor at the number below.    Please note that while we do our best to be available for urgent issues   outside of office hours, we are not available 24/7.   If you have an urgent issue and are unable to reach us, you may choose to seek medical care at your doctor's office, retail clinic, urgent care center, or emergency room.  If you have a medical emergency, please immediately call 911 or go to the emergency department.  Pager Numbers  - Dr. Kowalski: [phone]  - Dr. Moye: [phone]  - Dr. Stewart: [phone]  In the event of inclement weather, please call our main line at [phone] for an update on the status of any delays or closures.  Dermatology Medication Tips: Please  keep the boxes that topical medications come in in order to help keep track of the instructions about where and how to use these. Pharmacies typically print the medication instructions only on the boxes and not directly on the medication tubes.   If your medication is too expensive, please contact our office at [phone] option 4 or send us a message through MyChart.   We are unable to tell what your co-pay for medications will be in advance as this is different depending on your insurance coverage. However, we may be able to find a substitute medication at lower cost or fill out paperwork to get insurance to cover a needed medication.   If a prior authorization is required to get your medication covered by your insurance company, please allow us 1-2 business days to complete this process.  Drug prices often vary depending on where the prescription is filled and some pharmacies may offer cheaper prices.  The website www.goodrx.com contains coupons for medications through different pharmacies. The prices here do not account for what the cost may be with help from insurance (it may be cheaper with your insurance), but the website can give you the price if you did not use any insurance.  - You can print the associated coupon and take it with your prescription to the pharmacy.  - You may also stop by our office during regular business hours and pick up a GoodRx coupon card.  - If you need your prescription sent electronically to a different pharmacy, notify our office through [facility] MyChart or by phone at [phone] option 4.     Si Usted Necesita Algo Despus de Su Visita  Tambin puede enviarnos un mensaje a travs de MyChart. Por lo general respondemos a los mensajes de MyChart en el transcurso de 1 a 2 das hbiles.  Para renovar recetas, por favor pida a su farmacia que se ponga en contacto con nuestra oficina. Nuestro nmero de fax es el [phone].  Si tiene un asunto urgente  cuando la clnica est cerrada y que no puede esperar hasta el siguiente da hbil, puede llamar/localizar a su doctor(a) al nmero que aparece a continuacin.   Por favor, tenga en cuenta que aunque hacemos todo lo posible para estar disponibles para asuntos urgentes fuera del horario de oficina, no estamos disponibles las 24 horas del da, los 7 das de la semana.   Si tiene un problema urgente y no puede comunicarse con nosotros, puede optar por buscar atencin mdica  en el consultorio de su doctor(a), en una clnica privada, en un centro de atencin urgente o en una sala de emergencias.  Si tiene una emergencia mdica, por favor llame inmediatamente al 911 o vaya a la sala de emergencias.  Nmeros de bper  - Dr. Kowalski: [phone]  - Dra. Moye: [phone]  - Dra. Stewart: [phone]  En caso   de inclemencias del tiempo, por favor llame a nuestra lnea principal al [phone] para una actualizacin sobre el estado de cualquier retraso o cierre.  Consejos para la medicacin en dermatologa: Por favor, guarde las cajas en las que vienen los medicamentos de uso tpico para ayudarle a seguir las instrucciones sobre dnde y cmo usarlos. Las farmacias generalmente imprimen las instrucciones del medicamento slo en las cajas y no directamente en los tubos del medicamento.   Si su medicamento es muy caro, por favor, pngase en contacto con nuestra oficina llamando al [phone] y presione la opcin 4 o envenos un mensaje a travs de MyChart.   No podemos decirle cul ser su copago por los medicamentos por adelantado ya que esto es diferente dependiendo de la cobertura de su seguro. Sin embargo, es posible que podamos encontrar un medicamento sustituto a menor costo o llenar un formulario para que el seguro cubra el medicamento que se considera necesario.   Si se requiere una autorizacin previa para que su compaa de seguros cubra su medicamento, por favor permtanos de 1 a 2  das hbiles para completar este proceso.  Los precios de los medicamentos varan con frecuencia dependiendo del lugar de dnde se surte la receta y alguna farmacias pueden ofrecer precios ms baratos.  El sitio web www.goodrx.com tiene cupones para medicamentos de diferentes farmacias. Los precios aqu no tienen en cuenta lo que podra costar con la ayuda del seguro (puede ser ms barato con su seguro), pero el sitio web puede darle el precio si no utiliz ningn seguro.  - Puede imprimir el cupn correspondiente y llevarlo con su receta a la farmacia.  - Tambin puede pasar por nuestra oficina durante el horario de atencin regular y recoger una tarjeta de cupones de GoodRx.  - Si necesita que su receta se enve electrnicamente a una farmacia diferente, informe a nuestra oficina a travs de MyChart de [facility] o por telfono llamando al [phone] y presione la opcin 4.  

## 2022-05-25 NOTE — Progress Notes (Signed)
Follow-Up Visit   Subjective  Nancy Graham is a 52 y.o. female who presents for the following: Annual Exam.  The patient presents for Total-Body Skin Exam (TBSE) for skin cancer screening and mole check.  The patient has spots, moles and lesions to be evaluated, some may be new or changing. She has a history of BCCs and SCC.   The following portions of the chart were reviewed this encounter and updated as appropriate:       Review of Systems:  No other skin or systemic complaints except as noted in HPI or Assessment and Plan.  Objective  Well appearing patient in no apparent distress; mood and affect are within normal limits.  A full examination was performed including scalp, head, eyes, ears, nose, lips, neck, chest, axillae, abdomen, back, buttocks, bilateral upper extremities, bilateral lower extremities, hands, feet, fingers, toes, fingernails, and toenails. All findings within normal limits unless otherwise noted below.  right alar crease 1.5 mm firm flesh papule  face Erythema of the malar cheeks, chin, forehead with telangiectasias.  Left Upper Back 4.41m speckled brown macule  L mid upper back 442mbrown macule    Right Upper Back 66m58mnd 3mm31meckled brown macules adjacent R upper back    Right Breast 5mm 28mk flesh papule             Assessment & Plan  Skin cancer screening performed today.  Actinic Damage - chronic, secondary to cumulative UV radiation exposure/sun exposure over time - diffuse scaly erythematous macules with underlying dyspigmentation - Recommend daily broad spectrum sunscreen SPF 30+ to sun-exposed areas, reapply every 2 hours as needed.  - Recommend staying in the shade or wearing long sleeves, sun glasses (UVA+UVB protection) and wide brim hats (4-inch brim around the entire circumference of the hat). - Call for new or changing lesions.  Lentigines - Scattered tan macules - Due to sun exposure - Benign-appearing,  observe - Recommend daily broad spectrum sunscreen SPF 30+ to sun-exposed areas, reapply every 2 hours as needed. - Call for any changes  Seborrheic Keratoses - Stuck-on, waxy, tan-brown papules and/or plaques  - Benign-appearing - Discussed benign etiology and prognosis. - Observe - Call for any changes  Milia - tiny firm white papule, sternum above EDC site - type of cyst - benign - may be extracted if symptomatic - observe  Melanocytic Nevi - Tan-brown and/or pink-flesh-colored symmetric macules and papules - Benign appearing on exam today - Observation - Call clinic for new or changing moles - Recommend daily use of broad spectrum spf 30+ sunscreen to sun-exposed areas.   History of Basal Cell Carcinoma of the Skin - No evidence of recurrence today of the crown scalp and right upper arm - Recommend regular full body skin exams - Recommend daily broad spectrum sunscreen SPF 30+ to sun-exposed areas, reapply every 2 hours as needed.  - Call if any new or changing lesions are noted between office visits  History of Squamous Cell Carcinoma in Situ of the Skin - No evidence of recurrence today of the upper sternum - Recommend regular full body skin exams - Recommend daily broad spectrum sunscreen SPF 30+ to sun-exposed areas, reapply every 2 hours as needed.  - Call if any new or changing lesions are noted between office visits  Acrochordons (Skin Tags) - Fleshy, skin-colored pedunculated papules of the axilla - Benign appearing.  - Observe. - If desired, they can be removed with an in office procedure that is not  covered by insurance. - Please call the clinic if you notice any new or changing lesions.  Fibrous papule of nose right alar crease  Benign, observe.    Rosacea face  Chronic condition with duration or expected duration over one year. Currently well-controlled.   Rosacea is a chronic progressive skin condition usually affecting the face of adults,  causing redness and/or acne bumps. It is treatable but not curable. It sometimes affects the eyes (ocular rosacea) as well. It may respond to topical and/or systemic medication and can flare with stress, sun exposure, alcohol, exercise, topical steroids (including hydrocortisone/cortisone 10) and some foods.  Daily application of broad spectrum spf 30+ sunscreen to face is recommended to reduce flares.  Continue metronidazole 0.75% cream Apply to face qd/bid prn.  Related Medications metroNIDAZOLE (METROCREAM) 0.75 % cream Apply to face 1-2 times per day  Lentigo Left Upper Back  Benign-appearing.  Observation.  Call clinic for new or changing lesions.  Recommend daily use of broad spectrum spf 30+ sunscreen to sun-exposed areas.     Nevus (3) Right Breast; Right Upper Back; L mid upper back  Benign-appearing.  Observation.  Call clinic for new or changing moles.  Recommend daily use of broad spectrum spf 30+ sunscreen to sun-exposed areas.   Photos of the upper back taken today.    Return in about 1 year (around 05/26/2023) for TBSE, Hx BCC, Hx SCC.  IJamesetta Orleans, CMA, am acting as scribe for Brendolyn Patty, MD .  Documentation: I have reviewed the above documentation for accuracy and completeness, and I agree with the above.  Brendolyn Patty MD

## 2022-06-04 DIAGNOSIS — H9202 Otalgia, left ear: Secondary | ICD-10-CM | POA: Insufficient documentation

## 2022-06-04 DIAGNOSIS — H93292 Other abnormal auditory perceptions, left ear: Secondary | ICD-10-CM | POA: Insufficient documentation

## 2022-06-10 DIAGNOSIS — H90A22 Sensorineural hearing loss, unilateral, left ear, with restricted hearing on the contralateral side: Secondary | ICD-10-CM | POA: Diagnosis not present

## 2022-07-09 DIAGNOSIS — H93A2 Pulsatile tinnitus, left ear: Secondary | ICD-10-CM | POA: Diagnosis not present

## 2022-07-09 DIAGNOSIS — H905 Unspecified sensorineural hearing loss: Secondary | ICD-10-CM | POA: Diagnosis not present

## 2022-07-26 DIAGNOSIS — N951 Menopausal and female climacteric states: Secondary | ICD-10-CM | POA: Diagnosis not present

## 2022-08-11 DIAGNOSIS — L989 Disorder of the skin and subcutaneous tissue, unspecified: Secondary | ICD-10-CM | POA: Diagnosis not present

## 2022-08-30 DIAGNOSIS — M25552 Pain in left hip: Secondary | ICD-10-CM | POA: Diagnosis not present

## 2022-09-14 DIAGNOSIS — H903 Sensorineural hearing loss, bilateral: Secondary | ICD-10-CM | POA: Insufficient documentation

## 2022-09-14 DIAGNOSIS — H93A2 Pulsatile tinnitus, left ear: Secondary | ICD-10-CM | POA: Insufficient documentation

## 2022-09-17 DIAGNOSIS — Z79899 Other long term (current) drug therapy: Secondary | ICD-10-CM | POA: Diagnosis not present

## 2022-09-17 DIAGNOSIS — E7800 Pure hypercholesterolemia, unspecified: Secondary | ICD-10-CM | POA: Diagnosis not present

## 2022-09-17 DIAGNOSIS — G43909 Migraine, unspecified, not intractable, without status migrainosus: Secondary | ICD-10-CM | POA: Diagnosis not present

## 2022-09-17 DIAGNOSIS — K219 Gastro-esophageal reflux disease without esophagitis: Secondary | ICD-10-CM | POA: Diagnosis not present

## 2022-10-12 ENCOUNTER — Other Ambulatory Visit: Payer: Self-pay | Admitting: Dermatology

## 2022-10-12 DIAGNOSIS — L719 Rosacea, unspecified: Secondary | ICD-10-CM

## 2022-11-03 ENCOUNTER — Other Ambulatory Visit: Payer: Self-pay | Admitting: Dermatology

## 2022-11-10 ENCOUNTER — Ambulatory Visit: Payer: Federal, State, Local not specified - PPO | Admitting: Dermatology

## 2022-11-10 VITALS — BP 124/77 | HR 78

## 2022-11-10 DIAGNOSIS — W908XXA Exposure to other nonionizing radiation, initial encounter: Secondary | ICD-10-CM | POA: Diagnosis not present

## 2022-11-10 DIAGNOSIS — L814 Other melanin hyperpigmentation: Secondary | ICD-10-CM | POA: Diagnosis not present

## 2022-11-10 DIAGNOSIS — L821 Other seborrheic keratosis: Secondary | ICD-10-CM

## 2022-11-10 DIAGNOSIS — L820 Inflamed seborrheic keratosis: Secondary | ICD-10-CM | POA: Diagnosis not present

## 2022-11-10 DIAGNOSIS — L578 Other skin changes due to chronic exposure to nonionizing radiation: Secondary | ICD-10-CM | POA: Diagnosis not present

## 2022-11-10 NOTE — Progress Notes (Signed)
   Follow-Up Visit   Subjective  JANAII MONTAGNA is a 52 y.o. female who presents for the following: Spots on the face/hairline. Areas are new, some itchy and irritated. Hx of bleeding at one area, but has improved now.    The following portions of the chart were reviewed this encounter and updated as appropriate: medications, allergies, medical history  Review of Systems:  No other skin or systemic complaints except as noted in HPI or Assessment and Plan.  Objective  Well appearing patient in no apparent distress; mood and affect are within normal limits.  A focused examination was performed of the following areas: Face  Relevant physical exam findings are noted in the Assessment and Plan.  central upper forehead x 1, R temple hairline x 1 (2) Erythematous stuck-on, waxy papule    Assessment & Plan   Inflamed seborrheic keratosis (2) central upper forehead x 1, R temple hairline x 1  Symptomatic, irritating, patient would like treated.  Destruction of lesion - central upper forehead x 1, R temple hairline x 1  Destruction method: cryotherapy   Informed consent: discussed and consent obtained   Lesion destroyed using liquid nitrogen: Yes   Region frozen until ice ball extended beyond lesion: Yes   Outcome: patient tolerated procedure well with no complications   Post-procedure details: wound care instructions given   Additional details:  Prior to procedure, discussed risks of blister formation, small wound, skin dyspigmentation, or rare scar following cryotherapy. Recommend Vaseline ointment to treated areas while healing.   ACTINIC DAMAGE - chronic, secondary to cumulative UV radiation exposure/sun exposure over time - diffuse scaly erythematous macules with underlying dyspigmentation - Recommend daily broad spectrum sunscreen SPF 30+ to sun-exposed areas, reapply every 2 hours as needed.  - Recommend staying in the shade or wearing long sleeves, sun glasses (UVA+UVB  protection) and wide brim hats (4-inch brim around the entire circumference of the hat). - Call for new or changing lesions.  LENTIGINES Exam: scattered tan macules Due to sun exposure Treatment Plan: Benign-appearing, observe. Recommend daily broad spectrum sunscreen SPF 30+ to sun-exposed areas, reapply every 2 hours as needed.  Call for any changes  SEBORRHEIC KERATOSIS - Stuck-on, waxy, tan-brown papules and/or plaques  - Benign-appearing - Discussed benign etiology and prognosis. - Observe - Call for any changes    Return as scheduled, for TBSE, Hx SCC, Hx BCC.  ICherlyn Labella, CMA, am acting as scribe for Willeen Niece, MD .   Documentation: I have reviewed the above documentation for accuracy and completeness, and I agree with the above.  Willeen Niece, MD

## 2022-11-10 NOTE — Patient Instructions (Addendum)

## 2022-11-12 DIAGNOSIS — M1711 Unilateral primary osteoarthritis, right knee: Secondary | ICD-10-CM | POA: Diagnosis not present

## 2022-12-15 ENCOUNTER — Ambulatory Visit (INDEPENDENT_AMBULATORY_CARE_PROVIDER_SITE_OTHER): Payer: Federal, State, Local not specified - PPO

## 2022-12-15 ENCOUNTER — Encounter: Payer: Self-pay | Admitting: Podiatry

## 2022-12-15 ENCOUNTER — Ambulatory Visit: Payer: Federal, State, Local not specified - PPO | Admitting: Podiatry

## 2022-12-15 DIAGNOSIS — M7751 Other enthesopathy of right foot: Secondary | ICD-10-CM | POA: Diagnosis not present

## 2022-12-15 DIAGNOSIS — M7671 Peroneal tendinitis, right leg: Secondary | ICD-10-CM | POA: Diagnosis not present

## 2022-12-15 MED ORDER — METHYLPREDNISOLONE 4 MG PO TBPK: ORAL_TABLET | ORAL | 0 refills | Status: AC

## 2022-12-15 NOTE — Progress Notes (Signed)
She presents today chief concern of pain to the lateral ankle right she [place] and she twisted her foot and had a lot of swelling and burning.  This is the foot where previous surgery was performed.  She states that it has improved considerably since that time and she only has a little bit of burning pain at this point.  Objective: Vital signs are stable alert and oriented x 3.  Pulses are palpable.  She has good function of all of her tendons and has minimal swelling at the surgical site on the lateral aspect of the foot.  She does have some fluctuance in the deep soft tissues but does not appear to have any in the tendon sheaths.  Assessment: She has some peroneus longus tendinitis.  Plan: I started her on methylprednisolone to be followed by meloxicam and requested that she utilize her Tri-Lock brace and I will follow-up with her in 1 month.

## 2022-12-29 ENCOUNTER — Ambulatory Visit: Payer: Federal, State, Local not specified - PPO | Admitting: Podiatry

## 2023-01-04 ENCOUNTER — Telehealth: Payer: Self-pay | Admitting: Podiatry

## 2023-01-04 DIAGNOSIS — Z713 Dietary counseling and surveillance: Secondary | ICD-10-CM | POA: Diagnosis not present

## 2023-01-04 NOTE — Telephone Encounter (Signed)
Patient called stating she saw Dr Al Corpus about a month ago. She states he talked about a brace and she was wondering if she could get one.

## 2023-01-07 NOTE — Telephone Encounter (Signed)
Called pt let her know she can have brace, we will bill ins co.

## 2023-03-11 DIAGNOSIS — M25552 Pain in left hip: Secondary | ICD-10-CM | POA: Diagnosis not present

## 2023-03-28 DIAGNOSIS — G479 Sleep disorder, unspecified: Secondary | ICD-10-CM | POA: Diagnosis not present

## 2023-03-28 DIAGNOSIS — E7800 Pure hypercholesterolemia, unspecified: Secondary | ICD-10-CM | POA: Diagnosis not present

## 2023-03-28 DIAGNOSIS — Z6839 Body mass index (BMI) 39.0-39.9, adult: Secondary | ICD-10-CM | POA: Diagnosis not present

## 2023-04-11 DIAGNOSIS — Z1231 Encounter for screening mammogram for malignant neoplasm of breast: Secondary | ICD-10-CM | POA: Diagnosis not present

## 2023-04-11 DIAGNOSIS — Z01419 Encounter for gynecological examination (general) (routine) without abnormal findings: Secondary | ICD-10-CM | POA: Diagnosis not present

## 2023-05-31 ENCOUNTER — Ambulatory Visit: Payer: Federal, State, Local not specified - PPO | Admitting: Dermatology

## 2023-05-31 DIAGNOSIS — L304 Erythema intertrigo: Secondary | ICD-10-CM | POA: Diagnosis not present

## 2023-05-31 DIAGNOSIS — Z1283 Encounter for screening for malignant neoplasm of skin: Secondary | ICD-10-CM | POA: Diagnosis not present

## 2023-05-31 DIAGNOSIS — D1801 Hemangioma of skin and subcutaneous tissue: Secondary | ICD-10-CM

## 2023-05-31 DIAGNOSIS — L820 Inflamed seborrheic keratosis: Secondary | ICD-10-CM | POA: Diagnosis not present

## 2023-05-31 DIAGNOSIS — L719 Rosacea, unspecified: Secondary | ICD-10-CM

## 2023-05-31 DIAGNOSIS — Z86007 Personal history of in-situ neoplasm of skin: Secondary | ICD-10-CM

## 2023-05-31 DIAGNOSIS — L814 Other melanin hyperpigmentation: Secondary | ICD-10-CM

## 2023-05-31 DIAGNOSIS — L578 Other skin changes due to chronic exposure to nonionizing radiation: Secondary | ICD-10-CM

## 2023-05-31 DIAGNOSIS — D229 Melanocytic nevi, unspecified: Secondary | ICD-10-CM

## 2023-05-31 DIAGNOSIS — W908XXA Exposure to other nonionizing radiation, initial encounter: Secondary | ICD-10-CM

## 2023-05-31 DIAGNOSIS — Z85828 Personal history of other malignant neoplasm of skin: Secondary | ICD-10-CM

## 2023-05-31 DIAGNOSIS — D225 Melanocytic nevi of trunk: Secondary | ICD-10-CM

## 2023-05-31 DIAGNOSIS — L821 Other seborrheic keratosis: Secondary | ICD-10-CM

## 2023-05-31 MED ORDER — KETOCONAZOLE 2 % EX CREA: TOPICAL_CREAM | CUTANEOUS | 3 refills | Status: AC

## 2023-05-31 MED ORDER — HYDROCORTISONE 2.5 % EX CREA: TOPICAL_CREAM | CUTANEOUS | 3 refills | Status: AC

## 2023-05-31 MED ORDER — METRONIDAZOLE 0.75 % EX CREA
TOPICAL_CREAM | CUTANEOUS | 5 refills | Status: AC
Start: 2023-05-31 — End: ?

## 2023-05-31 NOTE — Patient Instructions (Addendum)

## 2023-05-31 NOTE — Progress Notes (Signed)
Follow-Up Visit   Subjective  Nancy Graham is a 53 y.o. female who presents for the following: Skin Cancer Screening and Full Body Skin Exam  The patient presents for Total-Body Skin Exam (TBSE) for skin cancer screening and mole check. The patient has spots, moles and lesions to be evaluated, some may be new or changing. She has a dark spot on her lip that she noticed several months ago. History of BCC and SCC.     The following portions of the chart were reviewed this encounter and updated as appropriate: medications, allergies, medical history  Review of Systems:  No other skin or systemic complaints except as noted in HPI or Assessment and Plan.  Objective  Well appearing patient in no apparent distress; mood and affect are within normal limits.  A full examination was performed including scalp, head, eyes, ears, nose, lips, neck, chest, axillae, abdomen, back, buttocks, bilateral upper extremities, bilateral lower extremities, hands, feet, fingers, toes, fingernails, and toenails. All findings within normal limits unless otherwise noted below.   Relevant physical exam findings are noted in the Assessment and Plan.  central upper forehead 2 mm pink scaly thin papule Left lower pretibia 4.0 mm pink brown scaly thin papule   Assessment & Plan   SKIN CANCER SCREENING PERFORMED TODAY.  ACTINIC DAMAGE - Chronic condition, secondary to cumulative UV/sun exposure - diffuse scaly erythematous macules with underlying dyspigmentation - Recommend daily broad spectrum sunscreen SPF 30+ to sun-exposed areas, reapply every 2 hours as needed.  - Staying in the shade or wearing long sleeves, sun glasses (UVA+UVB protection) and wide brim hats (4-inch brim around the entire circumference of the hat) are also recommended for sun protection.  - Call for new or changing lesions.  LENTIGINES, SEBORRHEIC KERATOSES, HEMANGIOMAS - Benign normal skin lesions - Benign-appearing - Call for  any changes  MELANOCYTIC NEVI - Tan-brown and/or pink-flesh-colored symmetric macules and papules; stable compared to photos 05/25/2022 - L mid upper back 4mm brown macule - Right Upper Back 4mm and 3mm speckled brown macules adjacent R upper back - Right Upper Breast 5mm pink flesh papule - Benign appearing on exam today - Observation - Call clinic for new or changing moles - Recommend daily use of broad spectrum spf 30+ sunscreen to sun-exposed areas.   LENTIGINES Exam:  - Left Upper Back 4.56mm speckled brown macule - Spinal upper back 3.0 mm speckled brown macule Due to sun exposure Treatment Plan: Benign-appearing, observe. Recommend daily broad spectrum sunscreen SPF 30+ to sun-exposed areas, reapply every 2 hours as needed.  Call for any changes  ROSACEA Exam Mid face erythema with telangiectasias   Chronic condition with duration or expected duration over one year. Currently well-controlled.   Rosacea is a chronic progressive skin condition usually affecting the face of adults, causing redness and/or acne bumps. It is treatable but not curable. It sometimes affects the eyes (ocular rosacea) as well. It may respond to topical and/or systemic medication and can flare with stress, sun exposure, alcohol, exercise, topical steroids (including hydrocortisone/cortisone 10) and some foods.  Daily application of broad spectrum spf 30+ sunscreen to face is recommended to reduce flares.  Treatment Plan Continue metronidazole 0.75% cream once to twice daily   INTERTRIGO Exam: Clear today  Chronic condition with duration or expected duration over one year. Currently well-controlled.  Intertrigo is a chronic recurrent rash that occurs in skin fold areas that may be associated with friction; heat; moisture; yeast; fungus; and bacteria.  It  is exacerbated by increased movement / activity; sweating; and higher atmospheric temperature.  Use of an absorbant powder such as Zeasorb AF powder or  other OTC antifungal powder to the area daily can prevent rash recurrence. Other options to help keep the area dry include blow drying the area after bathing or using antiperspirant products such as Duradry sweat minimizing gel.  Treatment Plan: Mix hydrocortisone with ketaconazole 2% twice a day. If improved, decrease to hydrocortisone and ketaconazole mixed once a day. If still clear, decrease to ketaconazole only.  HISTORY OF BASAL CELL CARCINOMA OF THE SKIN Crown scalp, 2021 Right upper arm, 2019 - No evidence of recurrence today - Recommend regular full body skin exams - Recommend daily broad spectrum sunscreen SPF 30+ to sun-exposed areas, reapply every 2 hours as needed.  - Call if any new or changing lesions are noted between office visits  HISTORY OF SQUAMOUS CELL CARCINOMA IN SITU OF THE SKIN Upper sternum, 2022 - No evidence of recurrence today - Recommend regular full body skin exams - Recommend daily broad spectrum sunscreen SPF 30+ to sun-exposed areas, reapply every 2 hours as needed.  - Call if any new or changing lesions are noted between office visits  HEMANGIOMA Exam: 1.5 mm violaceous papule at left lateral lower mucosal lip. Treatment Plan:  Discussed benign nature. Recommend observation. Call for changes. Discussed ED if becomes bothersome.   INFLAMED SEBORRHEIC KERATOSIS central upper forehead Small recurrence, not bothersome. Treated twice with cryotherapy. No history of bleeding. If changes, may biopsy. Recheck on follow-up.  SEBORRHEIC KERATOSIS, INFLAMED Left lower pretibia ISK vs AK vs Early BCC  Recheck on f/u. Destruction of lesion - Left lower pretibia  Destruction method: cryotherapy   Informed consent: discussed and consent obtained   Lesion destroyed using liquid nitrogen: Yes   Region frozen until ice ball extended beyond lesion: Yes   Outcome: patient tolerated procedure well with no complications   Post-procedure details: wound care  instructions given   Additional details:  Prior to procedure, discussed risks of blister formation, small wound, skin dyspigmentation, or rare scar following cryotherapy. Recommend Vaseline ointment to treated areas while healing.  ROSACEA   Related Medications metroNIDAZOLE (METROCREAM) 0.75 % cream Apply to face 1-2 times per day No follow-ups on file.  F/up 1 year TBSE  I, Cherlyn Labella, CMA, am acting as scribe for Willeen Niece, MD .   Documentation: I have reviewed the above documentation for accuracy and completeness, and I agree with the above.  Willeen Niece, MD

## 2023-06-23 DIAGNOSIS — R112 Nausea with vomiting, unspecified: Secondary | ICD-10-CM | POA: Diagnosis not present

## 2023-06-23 DIAGNOSIS — R109 Unspecified abdominal pain: Secondary | ICD-10-CM | POA: Diagnosis not present

## 2023-06-24 ENCOUNTER — Other Ambulatory Visit: Payer: Self-pay | Admitting: Family Medicine

## 2023-06-24 DIAGNOSIS — R109 Unspecified abdominal pain: Secondary | ICD-10-CM

## 2023-06-27 ENCOUNTER — Ambulatory Visit
Admission: RE | Admit: 2023-06-27 | Discharge: 2023-06-27 | Disposition: A | Discharge status: Home / Self Care | Payer: Federal, State, Local not specified - PPO | Source: Ambulatory Visit | Attending: Family Medicine | Admitting: Family Medicine

## 2023-06-27 DIAGNOSIS — N83201 Unspecified ovarian cyst, right side: Secondary | ICD-10-CM | POA: Diagnosis not present

## 2023-06-27 DIAGNOSIS — K5730 Diverticulosis of large intestine without perforation or abscess without bleeding: Secondary | ICD-10-CM | POA: Diagnosis not present

## 2023-06-27 DIAGNOSIS — N132 Hydronephrosis with renal and ureteral calculous obstruction: Secondary | ICD-10-CM | POA: Diagnosis not present

## 2023-06-27 DIAGNOSIS — R109 Unspecified abdominal pain: Secondary | ICD-10-CM

## 2023-07-04 DIAGNOSIS — R300 Dysuria: Secondary | ICD-10-CM | POA: Diagnosis not present

## 2023-07-06 DIAGNOSIS — N202 Calculus of kidney with calculus of ureter: Secondary | ICD-10-CM | POA: Diagnosis not present

## 2023-07-18 DIAGNOSIS — N201 Calculus of ureter: Secondary | ICD-10-CM | POA: Diagnosis not present

## 2023-08-04 DIAGNOSIS — N201 Calculus of ureter: Secondary | ICD-10-CM | POA: Diagnosis not present

## 2023-08-04 DIAGNOSIS — N130 Hydronephrosis with ureteropelvic junction obstruction: Secondary | ICD-10-CM | POA: Diagnosis not present

## 2023-08-10 DIAGNOSIS — K5730 Diverticulosis of large intestine without perforation or abscess without bleeding: Secondary | ICD-10-CM | POA: Diagnosis not present

## 2023-08-10 DIAGNOSIS — N130 Hydronephrosis with ureteropelvic junction obstruction: Secondary | ICD-10-CM | POA: Diagnosis not present

## 2023-08-10 DIAGNOSIS — N1330 Unspecified hydronephrosis: Secondary | ICD-10-CM | POA: Diagnosis not present

## 2023-09-02 DIAGNOSIS — R8271 Bacteriuria: Secondary | ICD-10-CM | POA: Diagnosis not present

## 2023-09-02 DIAGNOSIS — N200 Calculus of kidney: Secondary | ICD-10-CM | POA: Diagnosis not present

## 2023-09-14 DIAGNOSIS — N200 Calculus of kidney: Secondary | ICD-10-CM | POA: Diagnosis not present

## 2023-09-14 DIAGNOSIS — N130 Hydronephrosis with ureteropelvic junction obstruction: Secondary | ICD-10-CM | POA: Diagnosis not present

## 2023-09-15 DIAGNOSIS — N130 Hydronephrosis with ureteropelvic junction obstruction: Secondary | ICD-10-CM | POA: Diagnosis not present

## 2023-09-15 DIAGNOSIS — N200 Calculus of kidney: Secondary | ICD-10-CM | POA: Diagnosis not present

## 2023-09-15 DIAGNOSIS — R1084 Generalized abdominal pain: Secondary | ICD-10-CM | POA: Diagnosis not present

## 2023-10-11 DIAGNOSIS — Z131 Encounter for screening for diabetes mellitus: Secondary | ICD-10-CM | POA: Diagnosis not present

## 2023-10-11 DIAGNOSIS — E782 Mixed hyperlipidemia: Secondary | ICD-10-CM | POA: Diagnosis not present

## 2023-10-11 DIAGNOSIS — Z0000 Encounter for general adult medical examination without abnormal findings: Secondary | ICD-10-CM | POA: Diagnosis not present

## 2023-10-11 DIAGNOSIS — K219 Gastro-esophageal reflux disease without esophagitis: Secondary | ICD-10-CM | POA: Diagnosis not present

## 2023-10-11 DIAGNOSIS — G43909 Migraine, unspecified, not intractable, without status migrainosus: Secondary | ICD-10-CM | POA: Diagnosis not present

## 2023-10-26 ENCOUNTER — Encounter: Payer: Self-pay | Admitting: Podiatry

## 2023-10-26 ENCOUNTER — Ambulatory Visit: Admitting: Podiatry

## 2023-10-26 DIAGNOSIS — M7671 Peroneal tendinitis, right leg: Secondary | ICD-10-CM | POA: Diagnosis not present

## 2023-10-26 NOTE — Progress Notes (Signed)
 She presents today for follow-up of her peroneal tendinitis of her right foot.  She said it flared back up since I started exercising more.  She states is a little more swollen than it was not horrible but is noticeable.  Objective: Vital signs are stable alert oriented x 3 this is a for the previously had surgery on the peroneal tendons.  Palpation does not demonstrate any type of significant pain today though she does have burning of the proximal end of the incision.  Assessment: Peroneal tendinitis.  Plan: We are going to try physical therapy before any more imaging.  We sent her to [facility] whom she has had good luck with in the past we will follow-up with her once that is complete.

## 2023-11-02 ENCOUNTER — Ambulatory Visit: Admitting: Podiatry

## 2023-11-08 DIAGNOSIS — R2689 Other abnormalities of gait and mobility: Secondary | ICD-10-CM | POA: Diagnosis not present

## 2023-11-08 DIAGNOSIS — R2681 Unsteadiness on feet: Secondary | ICD-10-CM | POA: Diagnosis not present

## 2023-11-08 DIAGNOSIS — M25571 Pain in right ankle and joints of right foot: Secondary | ICD-10-CM | POA: Diagnosis not present

## 2023-11-16 DIAGNOSIS — R2689 Other abnormalities of gait and mobility: Secondary | ICD-10-CM | POA: Diagnosis not present

## 2023-11-16 DIAGNOSIS — R2681 Unsteadiness on feet: Secondary | ICD-10-CM | POA: Diagnosis not present

## 2023-11-16 DIAGNOSIS — M25571 Pain in right ankle and joints of right foot: Secondary | ICD-10-CM | POA: Diagnosis not present

## 2023-11-18 DIAGNOSIS — M25571 Pain in right ankle and joints of right foot: Secondary | ICD-10-CM | POA: Diagnosis not present

## 2023-11-18 DIAGNOSIS — R2689 Other abnormalities of gait and mobility: Secondary | ICD-10-CM | POA: Diagnosis not present

## 2023-11-18 DIAGNOSIS — R2681 Unsteadiness on feet: Secondary | ICD-10-CM | POA: Diagnosis not present

## 2023-11-22 DIAGNOSIS — R2681 Unsteadiness on feet: Secondary | ICD-10-CM | POA: Diagnosis not present

## 2023-11-22 DIAGNOSIS — R2689 Other abnormalities of gait and mobility: Secondary | ICD-10-CM | POA: Diagnosis not present

## 2023-11-22 DIAGNOSIS — M25571 Pain in right ankle and joints of right foot: Secondary | ICD-10-CM | POA: Diagnosis not present

## 2023-11-29 ENCOUNTER — Telehealth: Payer: Self-pay | Admitting: Podiatry

## 2023-11-29 DIAGNOSIS — R2681 Unsteadiness on feet: Secondary | ICD-10-CM | POA: Diagnosis not present

## 2023-11-29 DIAGNOSIS — R2689 Other abnormalities of gait and mobility: Secondary | ICD-10-CM | POA: Diagnosis not present

## 2023-11-29 DIAGNOSIS — M25571 Pain in right ankle and joints of right foot: Secondary | ICD-10-CM | POA: Diagnosis not present

## 2023-11-29 NOTE — Telephone Encounter (Signed)
 Is having pain going up her right leg and down into her foot.  Does she need to come in and see you?

## 2023-11-30 NOTE — Telephone Encounter (Signed)
 Spoke with patient is actually doing well continues physical therapy and is doing well.Advised patient to schedule appointment in office if gets worse or needs to see us .

## 2023-12-02 DIAGNOSIS — R2689 Other abnormalities of gait and mobility: Secondary | ICD-10-CM | POA: Diagnosis not present

## 2023-12-02 DIAGNOSIS — R2681 Unsteadiness on feet: Secondary | ICD-10-CM | POA: Diagnosis not present

## 2023-12-02 DIAGNOSIS — M25571 Pain in right ankle and joints of right foot: Secondary | ICD-10-CM | POA: Diagnosis not present

## 2023-12-07 DIAGNOSIS — R2689 Other abnormalities of gait and mobility: Secondary | ICD-10-CM | POA: Diagnosis not present

## 2023-12-07 DIAGNOSIS — M25571 Pain in right ankle and joints of right foot: Secondary | ICD-10-CM | POA: Diagnosis not present

## 2023-12-07 DIAGNOSIS — R2681 Unsteadiness on feet: Secondary | ICD-10-CM | POA: Diagnosis not present

## 2024-01-04 ENCOUNTER — Ambulatory Visit: Admitting: Podiatry

## 2024-01-11 DIAGNOSIS — M7062 Trochanteric bursitis, left hip: Secondary | ICD-10-CM | POA: Diagnosis not present

## 2024-03-22 DIAGNOSIS — N200 Calculus of kidney: Secondary | ICD-10-CM | POA: Diagnosis not present

## 2024-03-22 DIAGNOSIS — R350 Frequency of micturition: Secondary | ICD-10-CM | POA: Diagnosis not present

## 2024-03-26 DIAGNOSIS — M7062 Trochanteric bursitis, left hip: Secondary | ICD-10-CM | POA: Diagnosis not present

## 2024-04-12 DIAGNOSIS — K219 Gastro-esophageal reflux disease without esophagitis: Secondary | ICD-10-CM | POA: Diagnosis not present

## 2024-04-12 DIAGNOSIS — E782 Mixed hyperlipidemia: Secondary | ICD-10-CM | POA: Diagnosis not present

## 2024-04-12 DIAGNOSIS — G43909 Migraine, unspecified, not intractable, without status migrainosus: Secondary | ICD-10-CM | POA: Diagnosis not present

## 2024-04-13 DIAGNOSIS — R293 Abnormal posture: Secondary | ICD-10-CM | POA: Diagnosis not present

## 2024-04-13 DIAGNOSIS — M7062 Trochanteric bursitis, left hip: Secondary | ICD-10-CM | POA: Diagnosis not present

## 2024-04-17 DIAGNOSIS — Z1151 Encounter for screening for human papillomavirus (HPV): Secondary | ICD-10-CM | POA: Diagnosis not present

## 2024-04-17 DIAGNOSIS — Z01419 Encounter for gynecological examination (general) (routine) without abnormal findings: Secondary | ICD-10-CM | POA: Diagnosis not present

## 2024-04-17 DIAGNOSIS — Z1231 Encounter for screening mammogram for malignant neoplasm of breast: Secondary | ICD-10-CM | POA: Diagnosis not present

## 2024-04-17 DIAGNOSIS — Z124 Encounter for screening for malignant neoplasm of cervix: Secondary | ICD-10-CM | POA: Diagnosis not present

## 2024-04-18 DIAGNOSIS — R293 Abnormal posture: Secondary | ICD-10-CM | POA: Diagnosis not present

## 2024-04-18 DIAGNOSIS — M7062 Trochanteric bursitis, left hip: Secondary | ICD-10-CM | POA: Diagnosis not present

## 2024-04-20 DIAGNOSIS — R293 Abnormal posture: Secondary | ICD-10-CM | POA: Diagnosis not present

## 2024-04-20 DIAGNOSIS — M7062 Trochanteric bursitis, left hip: Secondary | ICD-10-CM | POA: Diagnosis not present

## 2024-04-24 DIAGNOSIS — M7062 Trochanteric bursitis, left hip: Secondary | ICD-10-CM | POA: Diagnosis not present

## 2024-04-24 DIAGNOSIS — R293 Abnormal posture: Secondary | ICD-10-CM | POA: Diagnosis not present

## 2024-04-27 DIAGNOSIS — M7062 Trochanteric bursitis, left hip: Secondary | ICD-10-CM | POA: Diagnosis not present

## 2024-04-27 DIAGNOSIS — R293 Abnormal posture: Secondary | ICD-10-CM | POA: Diagnosis not present

## 2024-05-08 DIAGNOSIS — M7062 Trochanteric bursitis, left hip: Secondary | ICD-10-CM | POA: Diagnosis not present

## 2024-05-08 DIAGNOSIS — R293 Abnormal posture: Secondary | ICD-10-CM | POA: Diagnosis not present

## 2024-06-05 ENCOUNTER — Encounter: Payer: Federal, State, Local not specified - PPO | Admitting: Dermatology

## 2024-07-03 ENCOUNTER — Encounter: Admitting: Dermatology
